# Patient Record
Sex: Female | Born: 1965 | Race: White | Marital: Single | Smoking: Never smoker
Health system: Southern US, Community
[De-identification: ages and names within clinical notes are randomized; demographics above are authoritative.]

## PROBLEM LIST (undated history)

## (undated) DIAGNOSIS — S83209A Unspecified tear of unspecified meniscus, current injury, unspecified knee, initial encounter: Secondary | ICD-10-CM

## (undated) DIAGNOSIS — G43909 Migraine, unspecified, not intractable, without status migrainosus: Secondary | ICD-10-CM

## (undated) DIAGNOSIS — T7840XA Allergy, unspecified, initial encounter: Secondary | ICD-10-CM

## (undated) DIAGNOSIS — F419 Anxiety disorder, unspecified: Secondary | ICD-10-CM

## (undated) DIAGNOSIS — G4700 Insomnia, unspecified: Secondary | ICD-10-CM

## (undated) DIAGNOSIS — F3281 Premenstrual dysphoric disorder: Secondary | ICD-10-CM

## (undated) HISTORY — DX: Anxiety disorder, unspecified: F41.9

## (undated) HISTORY — PX: TONSILLECTOMY: SHX5217

## (undated) HISTORY — DX: Allergy, unspecified, initial encounter: T78.40XA

## (undated) HISTORY — DX: Unspecified tear of unspecified meniscus, current injury, unspecified knee, initial encounter: S83.209A

## (undated) HISTORY — DX: Premenstrual dysphoric disorder: F32.81

## (undated) HISTORY — DX: Migraine, unspecified, not intractable, without status migrainosus: G43.909

## (undated) HISTORY — DX: Insomnia, unspecified: G47.00

---

## 2005-10-26 ENCOUNTER — Ambulatory Visit: Payer: Self-pay

## 2005-11-16 ENCOUNTER — Ambulatory Visit: Payer: Self-pay

## 2008-01-02 DIAGNOSIS — D229 Melanocytic nevi, unspecified: Secondary | ICD-10-CM

## 2008-01-02 HISTORY — DX: Melanocytic nevi, unspecified: D22.9

## 2009-12-16 ENCOUNTER — Encounter: Admission: RE | Admit: 2009-12-16 | Discharge: 2009-12-16 | Payer: Self-pay | Admitting: Internal Medicine

## 2012-02-02 ENCOUNTER — Ambulatory Visit (INDEPENDENT_AMBULATORY_CARE_PROVIDER_SITE_OTHER): Payer: Managed Care, Other (non HMO) | Admitting: Sports Medicine

## 2012-02-02 VITALS — BP 118/70

## 2012-02-02 DIAGNOSIS — M25559 Pain in unspecified hip: Secondary | ICD-10-CM

## 2012-02-02 DIAGNOSIS — M25551 Pain in right hip: Secondary | ICD-10-CM

## 2012-02-02 DIAGNOSIS — R269 Unspecified abnormalities of gait and mobility: Secondary | ICD-10-CM

## 2012-02-02 NOTE — Assessment & Plan Note (Signed)
Custom orthotics as above. 

## 2012-02-02 NOTE — Assessment & Plan Note (Signed)
Symptoms most likely related to upper hamstring as well as hip abductor strain. Radicular symptoms resolved. She may use oral NSAIDs, as well as I would like her to start the hamstring rehabilitation protocol. Orthotics as above. She may come back to see [place] on an as-needed basis.

## 2012-02-02 NOTE — Progress Notes (Signed)
  Subjective:    Patient ID: Gina [name], female    DOB: Oct 08, 1966, 46 y.o.   MRN: [id number]  HPI This patient comes to see [place] for new orthotics. She has rigid ones that were made at a different facility, three quarters length, which she does not particularly like. She did have an injury approximately 1 month ago while doing roller derby, where she was stretching, felt a pop in her right low back, and resultant pain traveling down the posterior aspect of her right thigh. She went to a few manipulations with Dr. Katrinka Blazing, as well as some tissue friction therapy. Overall the radicular type pain has resolved. She still has some pain in the posterior hip.  Past medical history: None. Past surgical history: None. Allergies: None. 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 Family history: Negative for diabetes, positive for heart disease, and high blood pressure. Review of Systems    No fevers, chills, night sweats, weight loss, chest pain, or shortness of breath.  Social History: Non-smoker. Objective:   Physical Exam General:  Well developed, well nourished, and in no acute distress. Neuro:  Alert and oriented x3, extra-ocular muscles intact. Skin: Warm and dry, no rashes noted. Respiratory:  Not using accessory muscles, speaking in full sentences. Musculoskeletal: Hip: ROM IR: 45 Deg, ER: 45 Deg, Flexion: 120 Deg, Extension: 100 Deg, Abduction: 45 Deg, Adduction: 45 Deg Strength IR: 5/5, ER: 5/5, Flexion: 5/5, Extension: 5/5, Abduction: 5/5, Adduction: 5/5 Pelvic alignment unremarkable to inspection and palpation. Standing hip rotation and gait without trendelenburg sign / unsteadiness. Greater trochanter without tenderness to palpation. No tenderness over piriformis and greater trochanter. No pain with FABER or FADIR. No SI joint tenderness and normal minimal SI movement.  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 Bunionette present. Leg lengths equal. Left calf better developed than RT Good hip abductor strength.  She lacks approximately 30 degrees of hamstring flexibility on the right side compared to the left.  Patient was fitted for a standard, cushioned, semi-rigid orthotic. The orthotic was heated and afterward the patient stood on the orthotic blank positioned on the orthotic stand. The patient was positioned in subtalar neutral position and 10 degrees of ankle dorsiflexion in a weight bearing stance. After completion of molding, a stable base was applied to the orthotic blank. The blank was ground to a stable position for weight bearing. Size: 8 Base: Blue EVA Posting and Padding: none  Comfortable in shoes with ambulation.    Assessment & Plan:

## 2012-02-02 NOTE — Patient Instructions (Signed)
Museum/gallery exhibitions officer.  Hamstring rehab: Hamstring curls: Start with 3 sets of 15 (no weight); Progress by 5 reps every 3 days until you reach 3 sets of 30; After 3 days at 3 sets of 30, add 2lb ankle weight at 3 sets of 10; Increase every 5 days by 5 reps. You may add 2lbs ankle weight once weekly. Hamstring swings- swing leg backwards and curl at the end of the swing. Follow same schedule as above. Hamstring running lunges- running lunge position means no more than 45 degrees of knee flexion and running motion. Follow same schedule as above.  Once the above becomes easy (takes at least a month), advance to:  Plyometrics: bound and land in running position; 3 sets of 15 level first; then up a rise; then down a rise; only advance when level is easy. No greater than 45 deg knee flexion. Stride drills- 10 x 75 yards; bounding but no more than 45 deg knee flexion. Level for 1 week; then add up hill for 1 week; then add down hill. Hop drills- 15 repetitive hops- 3 sets; compare injured to non injured leg Nordic hamstring exercises- stabilize body kneeling;1 set of 5-10 and progress slowly(Don't attempt if painful)  Come back to see [place] in 4-6 weeks if no better.

## 2012-12-19 ENCOUNTER — Ambulatory Visit (INDEPENDENT_AMBULATORY_CARE_PROVIDER_SITE_OTHER): Payer: Managed Care, Other (non HMO) | Admitting: Family Medicine

## 2012-12-19 VITALS — BP 120/80 | Ht 66.0 in | Wt 140.0 lb

## 2012-12-19 DIAGNOSIS — S43109A Unspecified dislocation of unspecified acromioclavicular joint, initial encounter: Secondary | ICD-10-CM

## 2012-12-19 HISTORY — DX: Unspecified dislocation of unspecified acromioclavicular joint, initial encounter: S43.109A

## 2012-12-19 NOTE — Progress Notes (Signed)
Chief complaint: Left shoulder pain  History of present illness: Patient is a 47 year old female who does roller derby on the weekends. Patient was doing roller derby had a tactile where she took 8 and other participating down and fell on the top of her left shoulder. Patient states that it does feel bruised but also is having trouble lifting her arm. Patient denies any radiation into the hand or any numbness but states that the pain does keep her up at night. She had this happen 2 days ago. Patient states that it minimal he is improving.  Past medical history, social, surgical and family history all reviewed.   Denies fever, chills, nausea vomiting abdominal pain, dysuria, chest pain, shortness of breath dyspnea on exertion or numbness in extremities  Physical exam Blood pressure 120/80, height 5\' 6"  (1.676 m), weight 140 lb (63.504 kg). General: No apparent distress alert and oriented x3 mood and affect somewhat normal. Respiratory: Patient's speak in full sentences and does not appear short of breath Skin: Warm dry intact with no signs of infection or rash Neuro: Cranial nerves II through XII are intact, neurovascularly intact in all extremities with 2+ DTRs and 2+ pulses. Shoulder exam: On inspection there is no gross deformity. On palpation patient is severely tender over the left a.c. Joint. Patient does have a positive crossover sign. Patient has no significant impingement signs but does have pain with certain movements. Patient has a negative empty can sign. She is neurovascularly intact distally. Patient's neck has full range of motion.  Muscle skeletal ultrasound was performed and interpreted by me. Patient's limited ultrasound study did show that she had some hypoechoic changes surrounding the a.c. Joint but no significant step-off. Patient's rotator cuff is intact with no hypoechoic changes or neovascularization in the area. No true tear seen.

## 2012-12-19 NOTE — Assessment & Plan Note (Signed)
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 Patient was offered a potential steroid injection today which she declined. Patient will continue with her over-the-counter anti-inflammatories and was given different range of motion exercises. Patient will stay out of contact sports for the next 2 weeks. Patient though she can followup if she does not notice any significant improvement in the next 2 weeks.

## 2013-07-13 ENCOUNTER — Encounter: Payer: Self-pay | Admitting: Family Medicine

## 2013-07-13 ENCOUNTER — Ambulatory Visit (INDEPENDENT_AMBULATORY_CARE_PROVIDER_SITE_OTHER): Payer: BC Managed Care – PPO | Admitting: Family Medicine

## 2013-07-13 VITALS — BP 124/78 | HR 62 | Wt 143.0 lb

## 2013-07-13 DIAGNOSIS — M999 Biomechanical lesion, unspecified: Secondary | ICD-10-CM

## 2013-07-13 DIAGNOSIS — S8002XA Contusion of left knee, initial encounter: Secondary | ICD-10-CM

## 2013-07-13 DIAGNOSIS — M9981 Other biomechanical lesions of cervical region: Secondary | ICD-10-CM

## 2013-07-13 DIAGNOSIS — S8000XA Contusion of unspecified knee, initial encounter: Secondary | ICD-10-CM | POA: Insufficient documentation

## 2013-07-13 DIAGNOSIS — M542 Cervicalgia: Secondary | ICD-10-CM | POA: Insufficient documentation

## 2013-07-13 NOTE — Assessment & Plan Note (Signed)
Diagnosis prognosis and rehabilitation exercises discussed with patient at length today. Patient is responding osteopathic manipulation. Continue current therapy. Patient was given a home exercise program and we did go over technique. Discussed posture and ergonomics at work. Follow up in 4-6 weeks

## 2013-07-13 NOTE — Progress Notes (Signed)
Subjective:    CC: Knee pain and manipulation for chronic neck pain  HPI: Patient is a 47 year old female who I've seen at a different practice previously for osteopathic manipulation. Patient is coming in with 2 problems.  1. neck pain- patient does do roller derby on a regular basis time to time after having significant amount Contact she can have discomfort. Patient does respond well to manipulation therapy. Patient also has a history of migraines and states that this usually is very helpful. Patient recently has been playing on 2 different roller derby teams and this seems to have increased exacerbation in her neck pain. Patient denies any radiation down her arm, denies any numbness or tingling. Patient states that it is more of a dull aching sensation on the right side. Patient denies any dizziness lightheadedness, fevers, chills, or any abnormal weight loss. Patient has tried over-the-counter natural supplements with minimal improvement.  2. Knee pain- left knee pain for 2-1/2 months. Patient remembers falling on the knee and did have some swelling. Patient does have a past medical history significant for meniscal tear of the medial meniscus on this knee. Patient fell directly onto this knee and continues to have pain any time that she has direct contact undersurface of the patella. Patient denies any swelling denies any numbness or tingling denies any radiation down the leg. Patient still able to do all her activities of daily living but unfortunately any direct contact seems to be tender. Patient has tried icing which has been somewhat beneficial. Patient is wearing a compression sleeve on the knee but this does not seem to be making a significant difference.  Past medical history, Surgical history, Family history not pertinant except as noted below, Social history, Allergies, and medications have been entered into the medical record, reviewed, and no changes needed.   Review of Systems: No  fevers, chills, night sweats, weight loss, chest pain, or shortness of breath abdominal pain, dizziness, lightheadedness, visual changes, numbness in the extremities  Objective:   Blood pressure 124/78, pulse 62, weight 143 lb (64.864 kg), SpO2 98.00%.  General: Well Developed, well nourished, and in no acute distress.  Neuro: Alert and oriented x3, extra-ocular muscles intact, sensation grossly intact.  HEENT: Normocephalic, atraumatic, pupils equal round reactive to light, neck supple, no masses, no lymphadenopathy, thyroid nonpalpable.  Skin: Warm and dry, no rashes. Cardiac:  no lower extremity edema. Respiratory: Not using accessory muscles, speaking in full sentences. Abdominal: NT, soft Gait: Nonantlagic, good balance and coordination Lymphatic: no lymphadenopathy in neck or axillae on palpation, non tender.  Musculoskeletal: Inspection and palpation of the right and left upper extremities including the shoulders elbows and wrist are unremarkable with full range of motion and good muscle strength and tone. Inspection and palpation of the right and left lower extremities including the  hip and ankles are unremarkable and nontender with full range of motion and good muscle strength and tone and are symmetric.  Knee: Left Normal to inspection with no erythema or effusion or obvious bony abnormalities. Patient does have some tenderness at the inferior pole of the patella, and medial joint line ROM full in flexion and extension and lower leg rotation. Ligaments with solid consistent endpoints including ACL, PCL, LCL, MCL. Mild positive McMurray's Painful patellar compression Patellar glide with mild crepitus. Patellar and quadriceps tendons unremarkable. Hamstring and quadriceps strength is normal.   Neck: Inspection unremarkable. Negative Spurling's maneuver. Full neck range of motion Grip strength and sensation normal in bilateral hands Strength good  C4 to T1 distribution No  sensory change to C4 to T1 Negative Hoffman sign bilaterally Reflexes normal  OMT Physical Exam  Standing flexion left  Seated Flexion left  Cervical  C2 right  C3 F RS left  Thoracic T7 E RS left  Lumbar L2 F RS left  Sacrum Left on left   Impression and Recommendations:

## 2013-07-13 NOTE — Assessment & Plan Note (Signed)
Decision today to treat with OMT was based on Physical Exam  After verbal consent patient was treated with hvla, me techniques in cervical and thoracic areas  Patient tolerated the procedure well with improvement in symptoms  Patient given exercises, stretches and lifestyle modifications  See medications in patient instructions if given  Patient will follow up in 4-6 weeks

## 2013-07-13 NOTE — Assessment & Plan Note (Signed)
Does have what appears to be a patella contusion. Patient likely has a chronicity of it is secondary to her continuing to play and having more of an overuse. Discuss strapping which patient will do with tape to try to avoid excessive tension put on the area secondary to the patellar tendon. Talked about icing protocol Discussed proper cushioning Patient come back in 4-6 weeks. She continues to have trouble we'll consider topical anti-inflammatory.

## 2013-07-13 NOTE — Patient Instructions (Addendum)
Get a strap or tape for the patella to change the pull of the tendon Get some type of cushioning on the knee.  Icing 20 minutes after activity Continue Vit D.  Call me if any questions.  Can come back in 4-6 weeks for manipulation

## 2014-02-11 ENCOUNTER — Ambulatory Visit (INDEPENDENT_AMBULATORY_CARE_PROVIDER_SITE_OTHER): Payer: BC Managed Care – PPO | Admitting: Family Medicine

## 2014-02-11 VITALS — BP 148/90 | HR 68

## 2014-02-11 DIAGNOSIS — M542 Cervicalgia: Secondary | ICD-10-CM

## 2014-02-11 DIAGNOSIS — S79929A Unspecified injury of unspecified thigh, initial encounter: Secondary | ICD-10-CM

## 2014-02-11 DIAGNOSIS — S76301A Unspecified injury of muscle, fascia and tendon of the posterior muscle group at thigh level, right thigh, initial encounter: Secondary | ICD-10-CM | POA: Insufficient documentation

## 2014-02-11 DIAGNOSIS — M9981 Other biomechanical lesions of cervical region: Secondary | ICD-10-CM

## 2014-02-11 DIAGNOSIS — S79919A Unspecified injury of unspecified hip, initial encounter: Secondary | ICD-10-CM

## 2014-02-11 DIAGNOSIS — M999 Biomechanical lesion, unspecified: Secondary | ICD-10-CM

## 2014-02-11 MED ORDER — DIAZEPAM 5 MG PO TABS: 5.0000 mg | ORAL_TABLET | Freq: Two times a day (BID) | ORAL | Status: DC | PRN

## 2014-02-11 NOTE — Assessment & Plan Note (Signed)
Patient continues to respond very well to osteopathic manipulation. Patient given more postural exercises today that could be helpful. Discuss other home remedies that could be beneficial. Patient will come back again in 3-6 weeks for further of manipulation and evaluation.

## 2014-02-11 NOTE — Patient Instructions (Signed)
Very nice to see you as always Try exercises for the hamstring You know where I am if you need me  If shoulder is not bette rin 2 weeks come back and we can do ultrasound and injection.

## 2014-02-11 NOTE — Assessment & Plan Note (Signed)
Patient is noticing tightness in her hamstring and I do as well on physical exam today. Patient given home exercises specifically askling exercises for the hamstring I think will be beneficial. Patient was showed proper technique today. Patient will try these interventions and come back and see me again in 3-6 weeks for further evaluation. If she is doing well we will add a Nordic exercises.

## 2014-02-11 NOTE — Progress Notes (Signed)
  Subjective:    CC: Knee pain and manipulation for chronic neck pain  HPI: Patient is a 48 year old female who I've seen at a different practice previously for osteopathic manipulation. Patient is coming in with 2 problems.  1. neck pain- patient has had neck pain for quite some time but has been able to do well with manipulation. Patient is having some discomfort most of the right side compared to her regular left side. Patient has been trying home exercises with no significant improvement. Patient has tried medicine with no improve. Patient continues to do roller derby  2. right hamstring pain-patient has had a chronic hamstring problem before and has noticed some mild tightness. Patient is not do any exercises for it is wondering if there is anything else. Patient denies any bruising, any swelling, and continues to wear the compression which has been helpful.  Past medical history, Surgical history, Family history not pertinant except as noted below, Social history, Allergies, and medications have been entered into the medical record, reviewed, and no changes needed.   Review of Systems: No fevers, chills, night sweats, weight loss, chest pain, or shortness of breath abdominal pain, dizziness, lightheadedness, visual changes, numbness in the extremities  Objective:   Blood pressure 148/90, pulse 68, SpO2 99.00%.  General: Well Developed, well nourished, and in no acute distress.  Neuro: Alert and oriented x3, extra-ocular muscles intact, sensation grossly intact.  HEENT: Normocephalic, atraumatic, pupils equal round reactive to light, neck supple, no masses, no lymphadenopathy, thyroid nonpalpable.  Skin: Warm and dry, no rashes. Cardiac:  no lower extremity edema. Respiratory: Not using accessory muscles, speaking in full sentences. Abdominal: NT, soft Gait: Nonantlagic, good balance and coordination Lymphatic: no lymphadenopathy in neck or axillae on palpation, non tender.   Musculoskeletal: Inspection and palpation of the right and left upper extremities including the shoulders elbows and wrist are unremarkable with full range of motion and good muscle strength and tone. Inspection and palpation of the right and left lower extremities including the  hip and ankles are unremarkable and nontender with full range of motion and good muscle strength and tone and are symmetric.  Knee: Left Normal to inspection with no erythema or effusion or obvious bony abnormalities. Patient does have some tenderness at the inferior pole of the patella, and medial joint line ROM full in flexion and extension and lower leg rotation. Ligaments with solid consistent endpoints including ACL, PCL, LCL, MCL. Mild positive McMurray's Painful patellar compression Patellar glide with mild crepitus. Patellar and quadriceps tendons unremarkable. Hamstring and quadriceps strength is normal. Patient does have mild tightness of the hamstrings bilaterally  Neck: Inspection unremarkable. Negative Spurling's maneuver. Full neck range of motion Grip strength and sensation normal in bilateral hands Strength good C4 to T1 distribution No sensory change to C4 to T1 Negative Hoffman sign bilaterally Reflexes normal  OMT Physical Exam  Standing flexion left  Seated Flexion left  Cervical  C2 right  C3 F RS left  Thoracic T7 E RS left  Lumbar L2 F RS left  Sacrum Left on left   Impression and Recommendations:

## 2014-02-11 NOTE — Assessment & Plan Note (Signed)
Decision today to treat with OMT was based on Physical Exam  After verbal consent patient was treated with hvla, me techniques in cervical and thoracic areas  Patient tolerated the procedure well with improvement in symptoms  Patient given exercises, stretches and lifestyle modifications  See medications in patient instructions if given  Patient will follow up in 3-6 weeks

## 2016-02-26 ENCOUNTER — Ambulatory Visit (INDEPENDENT_AMBULATORY_CARE_PROVIDER_SITE_OTHER): Payer: 59 | Admitting: Family Medicine

## 2016-02-26 VITALS — BP 122/80 | HR 64 | Wt 126.0 lb

## 2016-02-26 DIAGNOSIS — M542 Cervicalgia: Secondary | ICD-10-CM | POA: Diagnosis not present

## 2016-02-26 DIAGNOSIS — M9901 Segmental and somatic dysfunction of cervical region: Secondary | ICD-10-CM

## 2016-02-26 DIAGNOSIS — M9903 Segmental and somatic dysfunction of lumbar region: Secondary | ICD-10-CM

## 2016-02-26 DIAGNOSIS — M25551 Pain in right hip: Secondary | ICD-10-CM | POA: Diagnosis not present

## 2016-02-26 DIAGNOSIS — M9902 Segmental and somatic dysfunction of thoracic region: Secondary | ICD-10-CM

## 2016-02-26 DIAGNOSIS — M999 Biomechanical lesion, unspecified: Secondary | ICD-10-CM | POA: Insufficient documentation

## 2016-02-26 NOTE — Patient Instructions (Signed)
Good to see you  Ice is your friend On wall with heels, butt shoulder and head touching for a goal of 5 minutes daily  Keep me updated on any changes You know where I am and see me when you need me Bjorn Loser, look at his website Vivi Ferns for dry needling.

## 2016-02-26 NOTE — Assessment & Plan Note (Signed)
Decision today to treat with OMT was based on Physical Exam  After verbal consent patient was treated with hvla, me techniques in cervical and thoracic an lumbarareas  Patient tolerated the procedure well with improvement in symptoms  Patient given exercises, stretches and lifestyle modifications  See medications in patient instructions if given  Patient will follow up in 3-6 weeks

## 2016-02-26 NOTE — Progress Notes (Signed)
[name] [place], [place] [address] Phone: [phone] Subjective:      CC: right lower back pain and hip pain  [id number] Gina [name] is a 50 y.o. female coming in with complaint of right lower back and hip pain. Patient has not been seen for 2 years. Used to respond well to osteopathic manipulation for neck pain. Overall is feeling relatively well. States that she is starting have some increasing discomfort from time to time. Denies any radiation down the arm or any numbness or tingling. Does have muscle relaxers as well as benzodiazepines is sometimes helps when she needs it. Continues multiple different vitamins and minerals. Patient states that it is not stopping him from activity but has more difficulty with exercises and things.     Past Medical History  Diagnosis Date  . Allergy   . Migraine   . Acute meniscal tear of knee    No past surgical history on file. Social History   Social History  . Marital Status: Significant Other    Spouse Name: N/A  . Number of Children: N/A  . Years of Education: N/A   Social History Main Topics  . Smoking status: Not on file  . Smokeless tobacco: Not on file  . Alcohol Use: Yes  . Drug Use: No  . Sexual Activity: Yes     Comment: with females   Other Topics Concern  . Not on file   Social History Narrative  . No narrative on file   No Known Allergies No family history on file. No family history of rheumatological diseases  Past medical history, social, surgical and family history all reviewed in electronic medical record.  No pertanent information unless stated regarding to the chief complaint.   Review of Systems: No headache, visual changes, nausea, vomiting, diarrhea, constipation, dizziness, abdominal pain, skin rash, fevers, chills, night sweats, weight loss, swollen lymph nodes, body aches, joint swelling, muscle aches, chest pain, shortness of breath, mood  changes.   Objective Blood pressure 122/80, pulse 64, weight 126 lb (57.153 kg).  General: No apparent distress alert and oriented x3 mood and affect normal, dressed appropriately.  HEENT: Pupils equal, extraocular movements intact  Respiratory: Patient's speak in full sentences and does not appear short of breath  Cardiovascular: No lower extremity edema, non tender, no erythema  Skin: Warm dry intact with no signs of infection or rash on extremities or on axial skeleton.  Abdomen: Soft nontender  Neuro: Cranial nerves II through XII are intact, neurovascularly intact in all extremities with 2+ DTRs and 2+ pulses.  Lymph: No lymphadenopathy of posterior or anterior cervical chain or axillae bilaterally.  Gait normal with good balance and coordination.  MSK:  Non tender with full range of motion and good stability and symmetric strength and tone of shoulders, elbows, wrist, hip, knee and ankles bilaterally.  Back Exam:  Inspection: Unremarkable  Motion: Flexion 45 deg, Extension 25 deg, Side Bending to 45 deg bilaterally,  Rotation to 45 deg bilaterally  SLR laying: Negative  XSLR laying: Negative  Palpable tenderness: mildtenderness of the right sacroiliac joint FABER: positive right Sensory change: Gross sensation intact to all lumbar and sacral dermatomes.  Reflexes: 2+ at both patellar tendons, 2+ at achilles tendons, Babinski's downgoing.  Strength at foot  Plantar-flexion: 5/5 Dorsi-flexion: 5/5 Eversion: 5/5 Inversion: 5/5  Leg strength  Quad: 5/5 Hamstring: 5/5 Hip flexor: 5/5 Hip abductors: 4/5  Gait unremarkable.  Osteopathic findings C2 flexed  rotated and side bent right C6 flexed rotated and side bent right T3 extended rotated and side bent left L2 flexed rotated and side bent right    Impression and Recommendations:     This case required medical decision making of moderate complexity.      Note: This dictation was prepared with Dragon dictation along with  smaller phrase technology. Any transcriptional errors that result from this process are unintentional.

## 2016-02-26 NOTE — Assessment & Plan Note (Signed)
Patient states that she is responding fairly well to dry needling. Patient did have a tear of the hamstring previously. Overall though seems to be doing much better at this time. We discussed the possibility of further formal physical therapy which patient declined. Hopefully that the manipulation will be held full. Encourage her to continue to be active. Follow up in 4 weeks.

## 2016-02-26 NOTE — Assessment & Plan Note (Signed)
Overall patient continues respond to conservative therapy. We discussed postural exercises and given a handout today. We discussed what activities to avoid. Follow up in 4 weeks

## 2017-12-09 DIAGNOSIS — Z0001 Encounter for general adult medical examination with abnormal findings: Secondary | ICD-10-CM | POA: Diagnosis not present

## 2017-12-09 DIAGNOSIS — F39 Unspecified mood [affective] disorder: Secondary | ICD-10-CM | POA: Diagnosis not present

## 2017-12-09 DIAGNOSIS — N959 Unspecified menopausal and perimenopausal disorder: Secondary | ICD-10-CM | POA: Diagnosis not present

## 2017-12-09 DIAGNOSIS — F411 Generalized anxiety disorder: Secondary | ICD-10-CM | POA: Diagnosis not present

## 2017-12-13 DIAGNOSIS — D649 Anemia, unspecified: Secondary | ICD-10-CM | POA: Diagnosis not present

## 2017-12-16 DIAGNOSIS — D509 Iron deficiency anemia, unspecified: Secondary | ICD-10-CM | POA: Diagnosis not present

## 2017-12-16 DIAGNOSIS — F5089 Other specified eating disorder: Secondary | ICD-10-CM | POA: Diagnosis not present

## 2017-12-16 DIAGNOSIS — N959 Unspecified menopausal and perimenopausal disorder: Secondary | ICD-10-CM | POA: Diagnosis not present

## 2017-12-21 ENCOUNTER — Encounter: Payer: Self-pay | Admitting: Hematology and Oncology

## 2018-01-09 ENCOUNTER — Inpatient Hospital Stay: Payer: BLUE CROSS/BLUE SHIELD | Attending: Hematology and Oncology | Admitting: Hematology and Oncology

## 2018-01-09 ENCOUNTER — Telehealth: Payer: Self-pay | Admitting: Hematology and Oncology

## 2018-01-09 VITALS — BP 131/84 | HR 76 | Temp 98.5°F | Resp 20 | Ht 66.0 in | Wt 132.9 lb

## 2018-01-09 DIAGNOSIS — D509 Iron deficiency anemia, unspecified: Secondary | ICD-10-CM

## 2018-01-09 DIAGNOSIS — N920 Excessive and frequent menstruation with regular cycle: Secondary | ICD-10-CM | POA: Diagnosis not present

## 2018-01-09 DIAGNOSIS — F39 Unspecified mood [affective] disorder: Secondary | ICD-10-CM | POA: Diagnosis not present

## 2018-01-09 DIAGNOSIS — F5089 Other specified eating disorder: Secondary | ICD-10-CM | POA: Diagnosis not present

## 2018-01-09 MED ORDER — LAMOTRIGINE ER 50 MG PO TB24: 1.0000 | ORAL_TABLET | Freq: Every day | ORAL | Status: DC

## 2018-01-09 NOTE — Telephone Encounter (Signed)
Gave patient AVS and calendar of upcoming march and June appointments.

## 2018-01-09 NOTE — Assessment & Plan Note (Signed)
Iron deficiency anemia: I discussed with the patient the process of iron absorption. I counseled extensively regarding the different causes of iron deficiency including blood loss and malabsorption. I recommended that the patient see Gastroenterology.  She will think about it and let me know.  It is possible that the patient may still have an occult source of bleeding.  However malabsorption is also a possibility.  Recommendation: 1. Proceed with 2 doses of IV iron infusion with Feraheme 2. celiac antibodies will be obtained 3.  Patient is very interested in finding out about Hepcidin and Ferroportin  I discussed with her that they do not help as an management.  I discussed with the patient that potentially there may be a need for additional IV iron infusions if the iron levels were to remain low in the future. The frequency of need of IV iron would depend on many other factors including the degree of absorption.  Return to clinic in 3 months with recheck on iron studies and hemoglobin.

## 2018-01-09 NOTE — Progress Notes (Signed)
[facility] CONSULT NOTE  Patient Care Team: Hayden Rasmussen, MD as PCP - General (Family Medicine)  CHIEF COMPLAINTS/PURPOSE OF CONSULTATION:  Severe iron deficiency anemia  HISTORY OF PRESENTING ILLNESS:  Gina [name] 52 y.o. female is here because of chronic iron deficiency anemia.  Patient tells me that she was previously working for a integrative medicine physician and at that time she had received 3 doses of IV iron therapy and the last dose was 3 years ago.  She has been on oral iron therapy since she was age 35 on and off.  Most recently 3 weeks ago she was noted to have a hemoglobin of 6.1 and a ferritin of 2.5 and was started on oral iron supplementation.  She does not feel very symptomatic in spite of the low hemoglobin.  She denies any chest pain shortness of breath.  She does not have any dizziness or lightheadedness.  She does feel fatigued when she lifts heavy objects.  She denies any blood in the stool.  Her menstrual cycles have not been heavy.  She tells me occasionally she is intolerant to wheat products but most of the time she has no problems eating them. Currently she works at [employer]  I reviewed her records extensively and collaborated the history with the patient.  MEDICAL HISTORY:  Past Medical History:  Diagnosis Date  . Acute meniscal tear of knee   . Allergy   . Migraine     SURGICAL HISTORY: No past surgical history on file.  SOCIAL HISTORY: Social History   Socioeconomic History  . Marital status: Significant Other    Spouse name: Not on file  . Number of children: Not on file  . Years of education: Not on file  . Highest education level: Not on file  Social Needs  . Financial resource strain: Not on file  . Food insecurity - worry: Not on file  . Food insecurity - inability: Not on file  . Transportation needs - medical: Not on file  . Transportation needs - non-medical: Not on file  Occupational History  . Not  on file  Tobacco Use  . Smoking status: Not on file  Substance and Sexual Activity  . Alcohol use: Yes  . Drug use: No  . Sexual activity: Yes    Comment: with females  Other Topics Concern  . Not on file  Social History Narrative  . Not on file    FAMILY HISTORY: No family history on file.  ALLERGIES:  has No Known Allergies.  MEDICATIONS:  Current Outpatient Medications  Medication Sig Dispense Refill  . LamoTRIgine (LAMICTAL XR) 50 MG TB24 24 hour tablet Take 1 tablet (50 mg total) by mouth daily. 30 tablet    No current facility-administered medications for this visit.     REVIEW OF SYSTEMS:   Constitutional: Denies fevers, chills or abnormal night sweats Eyes: Denies blurriness of vision, double vision or watery eyes Ears, nose, mouth, throat, and face: Denies mucositis or sore throat Respiratory: Denies cough, dyspnea or wheezes Cardiovascular: Denies palpitation, chest discomfort or lower extremity swelling Gastrointestinal:  Denies nausea, heartburn or change in bowel habits Skin: Denies abnormal skin rashes Lymphatics: Denies new lymphadenopathy or easy bruising Neurological:Denies numbness, tingling or new weaknesses Behavioral/Psych: Mood is stable, no new changes   All other systems were reviewed with the patient and are negative.  PHYSICAL EXAMINATION: ECOG PERFORMANCE STATUS: 1 - Symptomatic but completely ambulatory  Vitals:   01/09/18 1547  BP: 131/84  Pulse: 76  Resp: 20  Temp: 98.5 F (36.9 C)  SpO2: 100%   Filed Weights   01/09/18 1547  Weight: 132 lb 14.4 oz (60.3 kg)    GENERAL:alert, no distress and comfortable SKIN: skin color, texture, turgor are normal, no rashes or significant lesions EYES: normal, conjunctiva are pink and non-injected, sclera clear OROPHARYNX:no exudate, no erythema and lips, buccal mucosa, and tongue normal  NECK: supple, thyroid normal size, non-tender, without nodularity LYMPH:  no palpable lymphadenopathy  in the cervical, axillary or inguinal LUNGS: clear to auscultation and percussion with normal breathing effort HEART: regular rate & rhythm and no murmurs and no lower extremity edema ABDOMEN:abdomen soft, non-tender and normal bowel sounds Musculoskeletal:no cyanosis of digits and no clubbing  PSYCH: alert & oriented x 3 with fluent speech NEURO: no focal motor/sensory deficits  RADIOGRAPHIC STUDIES: I have personally reviewed the radiological reports and agreed with the findings in the report.  ASSESSMENT AND PLAN:  Iron deficiency anemia CBC differential on 12/20/2017: Hemoglobin 6.1, MCV 78.9, platelets 526, ferritin 2.7 Iron deficiency anemia: I discussed with the patient the process of iron absorption. I counseled extensively regarding the different causes of iron deficiency including blood loss and malabsorption. I recommended that the patient see Gastroenterology.  She will think about it and let me know.  It is possible that the patient may still have an occult source of bleeding.  However malabsorption is also a possibility.  Recommendation: 1. Proceed with 2 doses of IV iron infusion with Feraheme 2. celiac antibodies will be obtained 3.  Patient is very interested in finding out about Hepcidin and Ferroportin  I discussed with her that they do not help as an management.  I discussed with the patient that potentially there may be a need for additional IV iron infusions if the iron levels were to remain low in the future. The frequency of need of IV iron would depend on many other factors including the degree of absorption.  Return to clinic in 3 months with recheck on iron studies and hemoglobin.     All questions were answered. The patient knows to call the clinic with any problems, questions or concerns.    Harriette Ohara, MD 01/09/18

## 2018-01-11 ENCOUNTER — Other Ambulatory Visit: Payer: Self-pay | Admitting: Hematology and Oncology

## 2018-01-13 ENCOUNTER — Inpatient Hospital Stay: Payer: BLUE CROSS/BLUE SHIELD

## 2018-01-13 VITALS — BP 126/82 | HR 63 | Temp 98.6°F | Resp 16

## 2018-01-13 DIAGNOSIS — D509 Iron deficiency anemia, unspecified: Secondary | ICD-10-CM

## 2018-01-13 LAB — CBC WITH DIFFERENTIAL (CANCER CENTER ONLY)
BASOS ABS: 0 10*3/uL (ref 0.0–0.1)
Basophils Relative: 1 %
EOS ABS: 0 10*3/uL (ref 0.0–0.5)
Eosinophils Relative: 1 %
HCT: 36 % (ref 34.8–46.6)
HEMOGLOBIN: 11.2 g/dL — AB (ref 11.6–15.9)
LYMPHS ABS: 0.9 10*3/uL (ref 0.9–3.3)
LYMPHS PCT: 26 %
MCH: 24.5 pg — AB (ref 25.1–34.0)
MCHC: 31.2 g/dL — ABNORMAL LOW (ref 31.5–36.0)
MCV: 78.5 fL — ABNORMAL LOW (ref 79.5–101.0)
Monocytes Absolute: 0.3 10*3/uL (ref 0.1–0.9)
Monocytes Relative: 9 %
NEUTROS PCT: 63 %
Neutro Abs: 2.1 10*3/uL (ref 1.5–6.5)
Platelet Count: 340 10*3/uL (ref 145–400)
RBC: 4.58 MIL/uL (ref 3.70–5.45)
RDW: 29.1 % — ABNORMAL HIGH (ref 11.2–14.5)
WBC: 3.3 10*3/uL — AB (ref 3.9–10.3)

## 2018-01-13 LAB — IRON AND TIBC
IRON: 126 ug/dL (ref 41–142)
SATURATION RATIOS: 35 % (ref 21–57)
TIBC: 364 ug/dL (ref 236–444)
UIBC: 238 ug/dL

## 2018-01-13 LAB — FERRITIN: FERRITIN: 23 ng/mL (ref 9–269)

## 2018-01-13 MED ORDER — FERUMOXYTOL INJECTION 510 MG/17 ML
510.0000 mg | Freq: Once | INTRAVENOUS | Status: AC
  Administered 2018-01-13: 510 mg via INTRAVENOUS
  Filled 2018-01-13: qty 17

## 2018-01-13 MED ORDER — SODIUM CHLORIDE 0.9 % IV SOLN
Freq: Once | INTRAVENOUS | Status: AC
  Administered 2018-01-13: 08:00:00 via INTRAVENOUS

## 2018-01-13 NOTE — Patient Instructions (Signed)

## 2018-01-17 LAB — CELIAC PANEL 10
Antigliadin Abs, IgA: 4 units (ref 0–19)
Endomysial Ab, IgA: NEGATIVE
Gliadin IgG: 4 units (ref 0–19)
IgA: 251 mg/dL (ref 87–352)
Tissue Transglut Ab: <2 U/mL (ref 0–5)

## 2018-01-20 ENCOUNTER — Inpatient Hospital Stay: Payer: BLUE CROSS/BLUE SHIELD

## 2018-01-20 VITALS — BP 145/86 | HR 65 | Temp 98.3°F | Resp 18

## 2018-01-20 DIAGNOSIS — D509 Iron deficiency anemia, unspecified: Secondary | ICD-10-CM | POA: Diagnosis not present

## 2018-01-20 MED ORDER — SODIUM CHLORIDE 0.9 % IV SOLN
Freq: Once | INTRAVENOUS | Status: AC
  Administered 2018-01-20: 08:00:00 via INTRAVENOUS

## 2018-01-20 MED ORDER — SODIUM CHLORIDE 0.9 % IV SOLN
510.0000 mg | Freq: Once | INTRAVENOUS | Status: AC
  Administered 2018-01-20: 510 mg via INTRAVENOUS
  Filled 2018-01-20: qty 17

## 2018-01-20 NOTE — Progress Notes (Signed)
Pt tolerated iron infusion well.  VS stable.  Did not stay for 30 min post observation period.  A&Ox4 and ambulatory.

## 2018-01-20 NOTE — Patient Instructions (Signed)

## 2018-02-15 NOTE — Progress Notes (Signed)
[name] [address], [place] [address] Phone: [phone] Subjective:        CC: Right wrist pain  IRJ:[id number]  Gina [name] is a 52 y.o. female coming in with complaint of right wrist pain. She has had pain for the past 3 weeks. She feels that she has bad ergonomic. The lateral aspect of the elbow. Pain travels over the extensor side of forearm into the middle finger. Denies any numbness or tingling. Pain gets better on the weekends.  Patient states that it seems to be more secondary to her work than anything else.  Tries to workout but does not do it regularly.      Past Medical History:  Diagnosis Date  . Acute meniscal tear of knee   . Allergy   . Migraine    No past surgical history on file. Social History   Socioeconomic History  . Marital status: Significant Other    Spouse name: Not on file  . Number of children: Not on file  . Years of education: Not on file  . Highest education level: Not on file  Occupational History  . Not on file  Social Needs  . Financial resource strain: Not on file  . Food insecurity:    Worry: Not on file    Inability: Not on file  . Transportation needs:    Medical: Not on file    Non-medical: Not on file  Tobacco Use  . Smoking status: Not on file  Substance and Sexual Activity  . Alcohol use: Yes  . Drug use: No  . Sexual activity: Yes    Comment: with females  Lifestyle  . Physical activity:    Days per week: Not on file    Minutes per session: Not on file  . Stress: Not on file  Relationships  . Social connections:    Talks on phone: Not on file    Gets together: Not on file    Attends religious service: Not on file    Active member of club or organization: Not on file    Attends meetings of clubs or organizations: Not on file    Relationship status: Not on file  Other Topics Concern  . Not on file  Social History Narrative  . Not on file   No Known  Allergies No family history on file.  No family history of autoimmune disease   Past medical history, social, surgical and family history all reviewed in electronic medical record.  No pertanent information unless stated regarding to the chief complaint.   Review of Systems:Review of systems updated and as accurate as of 02/16/18  No headache, visual changes, nausea, vomiting, diarrhea, constipation, dizziness, abdominal pain, skin rash, fevers, chills, night sweats, weight loss, swollen lymph nodes, body aches, joint swelling, chest pain, shortness of breath, mood changes.  Positive muscle aches  Objective  Blood pressure 118/76, pulse 79, height 5\' 6"  (1.676 m), weight 133 lb (60.3 kg), SpO2 99 %. Systems examined below as of 02/16/18   General: No apparent distress alert and oriented x3 mood and affect normal, dressed appropriately.  HEENT: Pupils equal, extraocular movements intact  Respiratory: Patient's speak in full sentences and does not appear short of breath  Cardiovascular: No lower extremity edema, non tender, no erythema  Skin: Warm dry intact with no signs of infection or rash on extremities or on axial skeleton.  Abdomen: Soft nontender  Neuro: Cranial nerves II through XII are  intact, neurovascularly intact in all extremities with 2+ DTRs and 2+ pulses.  Lymph: No lymphadenopathy of posterior or anterior cervical chain or axillae bilaterally.  Gait normal with good balance and coordination.  MSK:  Non tender with full range of motion and good stability and symmetric strength and tone of shoulders,  hip, knee and ankles bilaterally.  Wrist: Inspection normal with no visible erythema or swelling. ROM smooth and normal with good flexion and extension and ulnar/radial deviation that is symmetrical with opposite wrist. Palpation is normal over metacarpals, navicular, lunate, and TFCC; tendons without tenderness/ swelling No snuffbox tenderness. No tenderness over [place]. Strength 5/5 in all directions without pain. Negative Finkelstein, tinel's and phalens. Negative Watson's test. Patient though does not pain with resisted extension of the elbow.  MSK US performed of: Right elbow This study was ordered, performed, and interpreted by Charlann Boxer D.O.  Right elbow shows the patient does have a large partial-thickness tear of the common extensor tendon.  No avulsion fracture noted.  Hypoechoic changes and increasing Doppler flow noted.  IMPRESSION: Common extensor tendon tear    Impression and Recommendations:     This case required medical decision making of moderate complexity.      Note: This dictation was prepared with Dragon dictation along with smaller phrase technology. Any transcriptional errors that result from this process are unintentional.

## 2018-02-16 ENCOUNTER — Ambulatory Visit: Payer: Self-pay

## 2018-02-16 ENCOUNTER — Ambulatory Visit: Payer: BLUE CROSS/BLUE SHIELD | Admitting: Family Medicine

## 2018-02-16 VITALS — BP 118/76 | HR 79 | Ht 66.0 in | Wt 133.0 lb

## 2018-02-16 DIAGNOSIS — M25521 Pain in right elbow: Secondary | ICD-10-CM

## 2018-02-16 DIAGNOSIS — M7711 Lateral epicondylitis, right elbow: Secondary | ICD-10-CM | POA: Insufficient documentation

## 2018-02-16 DIAGNOSIS — M7712 Lateral epicondylitis, left elbow: Secondary | ICD-10-CM | POA: Insufficient documentation

## 2018-02-16 MED ORDER — DIAZEPAM 5 MG PO TABS: 5.0000 mg | ORAL_TABLET | Freq: Two times a day (BID) | ORAL | 0 refills | Status: DC | PRN

## 2018-02-16 MED ORDER — NITROGLYCERIN 0.2 MG/HR TD PT24
MEDICATED_PATCH | TRANSDERMAL | 1 refills | Status: DC
Start: 2018-02-16 — End: 2018-05-04

## 2018-02-16 NOTE — Assessment & Plan Note (Signed)
Lateral epicondylitis tear noted.  Discussed icing regimen and home exercises.  Patient given a wrist brace.  Topical anti-inflammatories and nitroglycerin.

## 2018-02-16 NOTE — Patient Instructions (Addendum)
Good to see you  Gina [name] is your friend.  Stay active.  Avoid overhand lifting.  Wear brace day and night for 2 weeks then nightly for 2 week.  Nitroglycerin Protocol   Apply 1/4 nitroglycerin patch to affected area daily.  Change position of patch within the affected area every 24 hours.  You may experience a headache during the first 1-2 weeks of using the patch, these should subside.  If you experience headaches after beginning nitroglycerin patch treatment, you may take your preferred over the counter pain reliever.  Another side effect of the nitroglycerin patch is skin irritation or rash related to patch adhesive.  Please notify our office if you develop more severe headaches or rash, and stop the patch.  Tendon healing with nitroglycerin patch may require 12 to 24 weeks depending on the extent of injury.  Men should not use if taking Viagra, Cialis, or Levitra.   Do not use if you have migraines or rosacea.  See me again in 4-5 weeks

## 2018-02-24 ENCOUNTER — Telehealth: Payer: Self-pay

## 2018-02-24 NOTE — Telephone Encounter (Signed)
Returned pt call regarding needing results for celiac testing. Results given. Mailed to patient per her request. Cyndia Bent RN

## 2018-03-22 ENCOUNTER — Telehealth: Payer: Self-pay

## 2018-03-22 NOTE — Progress Notes (Signed)
[name] [address], [place] [address] Phone: [phone] Subjective:    I'm seeing this patient by the request  of:    CC: Elbow pain follow-up  SNK:[id number]  Gina [name] is a 52 y.o. female coming in with complaint of elbow pain follow-up.  Was found to have a lateral epicondylitis.  Patient did have some intrasubstance tearing noted on ultrasound.  Started nitroglycerin having no side effects.  Feels like she is making progress.  Approximately 60 to 70% better.  Still some discomfort throughout the day but nothing severe.      Past Medical History:  Diagnosis Date  . Acute meniscal tear of knee   . Allergy   . Migraine    No past surgical history on file. Social History   Socioeconomic History  . Marital status: Significant Other    Spouse name: Not on file  . Number of children: Not on file  . Years of education: Not on file  . Highest education level: Not on file  Occupational History  . Not on file  Social [name]  . Financial resource strain: Not on file  . Food insecurity:    Worry: Not on file    Inability: Not on file  . Transportation [name]:    Medical: Not on file    Non-medical: Not on file  Tobacco Use  . Smoking status: Never Smoker  . Smokeless tobacco: Never Used  Substance and Sexual Activity  . Alcohol use: Yes  . Drug use: No  . Sexual activity: Yes    Comment: with females  Lifestyle  . Physical activity:    Days per week: Not on file    Minutes per session: Not on file  . Stress: Not on file  Relationships  . Social connections:    Talks on phone: Not on file    Gets together: Not on file    Attends religious service: Not on file    Active member of club or organization: Not on file    Attends meetings of clubs or organizations: Not on file    Relationship status: Not on file  Other Topics Concern  . Not on file  Social History Narrative  . Not on file   No Known Allergies No  family history on file.   Past medical history, social, surgical and family history all reviewed in electronic medical record.  No pertanent information unless stated regarding to the chief complaint.   Review of Systems:Review of systems updated and as accurate as of 03/23/18  No headache, visual changes, nausea, vomiting, diarrhea, constipation, dizziness, abdominal pain, skin rash, fevers, chills, night sweats, weight loss, swollen lymph nodes, body aches, joint swelling, muscle aches, chest pain, shortness of breath, mood changes.   Objective  Blood pressure (!) 148/90, pulse (!) 52, height 5\' 6"  (1.676 m), weight 134 lb (60.8 kg), SpO2 97 %. Systems examined below as of 03/23/18   General: No apparent distress alert and oriented x3 mood and affect normal, dressed appropriately.  HEENT: Pupils equal, extraocular movements intact  Respiratory: Patient's speak in full sentences and does not appear short of breath  Cardiovascular: No lower extremity edema, non tender, no erythema  Skin: Warm dry intact with no signs of infection or rash on extremities or on axial skeleton.  Abdomen: Soft nontender  Neuro: Cranial nerves II through XII are intact, neurovascularly intact in all extremities with 2+ DTRs and 2+ pulses.  Lymph: No lymphadenopathy  of posterior or anterior cervical chain or axillae bilaterally.  Gait normal with good balance and coordination.  MSK:  Non tender with full range of motion and good stability and symmetric strength and tone of shoulders,  wrist, hip, knee and ankles bilaterally.  Elbow: Right Unremarkable to inspection. Range of motion full pronation, supination, flexion, extension. Strength is full to all of the above directions Stable to varus, valgus stress. Negative moving valgus stress test. Mild discomfort over the lateral epicondylar region Ulnar nerve does not sublux. Negative cubital tunnel Tinel's. Contralateral elbow unremarkable  Musculoskeletal  ultrasound was performed and interpreted by Charlann Boxer D.O.   Elbow: Right Lateral epicondyle and common extensor tendon origin visualized.  Significant improvement in the intrasubstance tearing except for the deep fibers.  Patient still has some mild retraction in that area.  Increasing Doppler flow noted.  Significant decrease in hypoechoic changes  IMPRESSION: Interval healing of the lateral epicondylar area.    Impression and Recommendations:     This case required medical decision making of moderate complexity.      Note: This dictation was prepared with Dragon dictation along with smaller phrase technology. Any transcriptional errors that result from this process are unintentional.       '

## 2018-03-22 NOTE — Telephone Encounter (Signed)
Received VM from pt in regards to $300 bill for labwork that she didn't agree to. Returned pt's call and she reports she didn't agree to the following labs: Iron and TIBC, Ferritin, and CBC with diff.  She said she only agreed to the Celiac panel.  I asked if she had spoke with patient billing and she said they told her to call doctor's office.  Let her know I will route this note to Dr Lindi Adie.  No other needs per pt at this time.

## 2018-03-23 ENCOUNTER — Ambulatory Visit: Payer: Self-pay

## 2018-03-23 ENCOUNTER — Encounter: Payer: Self-pay | Admitting: Family Medicine

## 2018-03-23 ENCOUNTER — Ambulatory Visit: Payer: BLUE CROSS/BLUE SHIELD | Admitting: Family Medicine

## 2018-03-23 VITALS — BP 148/90 | HR 52 | Ht 66.0 in | Wt 134.0 lb

## 2018-03-23 DIAGNOSIS — M25521 Pain in right elbow: Secondary | ICD-10-CM

## 2018-03-23 DIAGNOSIS — M7711 Lateral epicondylitis, right elbow: Secondary | ICD-10-CM

## 2018-03-23 NOTE — Patient Instructions (Signed)
You are awesome See me again in 6 weeks  

## 2018-03-23 NOTE — Assessment & Plan Note (Signed)
Improved at this time.  Discussed icing regimen and home exercise.  Discussed which activities to doing which wants to avoid.  Patient is to increase activity as tolerated.  Follow-up with me again in 3 to 6 weeks to ensure healing appropriately.  Continue nitroglycerin at this time

## 2018-04-10 ENCOUNTER — Telehealth: Payer: Self-pay | Admitting: Hematology and Oncology

## 2018-04-10 NOTE — Telephone Encounter (Signed)
Patient called to cancel she will call back

## 2018-04-11 ENCOUNTER — Other Ambulatory Visit: Payer: BLUE CROSS/BLUE SHIELD

## 2018-04-13 ENCOUNTER — Ambulatory Visit: Payer: BLUE CROSS/BLUE SHIELD | Admitting: Hematology and Oncology

## 2018-05-03 NOTE — Progress Notes (Signed)
[name] [address], [place] [address] Phone: [phone] Subjective:     CC: Elbow pain follow-up and neck and back pain follow-up  PIR:[id number]  Gina [name] is a 52 y.o. female coming in with complaint of right elbow pain.  Found to have a more of a lateral epicondylitis.  Patient is doing better.  States that she is 80% better.  Continues to gabapentin intermittently.  Doing the exercises intermittently.  Not stopping her from activities but still can be very uncomfortable.  Neck and back pain.  Does have history of migraines.  Had been doing very well with conservative therapy and osteopathic manipulation.     Past Medical History:  Diagnosis Date  . Acute meniscal tear of knee   . Allergy   . Migraine    No past surgical history on file. Social History   Socioeconomic History  . Marital status: Significant Other    Spouse name: Not on file  . Number of children: Not on file  . Years of education: Not on file  . Highest education level: Not on file  Occupational History  . Not on file  Social Needs  . Financial resource strain: Not on file  . Food insecurity:    Worry: Not on file    Inability: Not on file  . Transportation needs:    Medical: Not on file    Non-medical: Not on file  Tobacco Use  . Smoking status: Never Smoker  . Smokeless tobacco: Never Used  Substance and Sexual Activity  . Alcohol use: Yes  . Drug use: No  . Sexual activity: Yes    Comment: with females  Lifestyle  . Physical activity:    Days per week: Not on file    Minutes per session: Not on file  . Stress: Not on file  Relationships  . Social connections:    Talks on phone: Not on file    Gets together: Not on file    Attends religious service: Not on file    Active member of club or organization: Not on file    Attends meetings of clubs or organizations: Not on file    Relationship status: Not on file  Other Topics Concern   . Not on file  Social History Narrative  . Not on file   No Known Allergies No family history on file.  No family history of autoimmune   Past medical history, social, surgical and family history all reviewed in electronic medical record.  No pertanent information unless stated regarding to the chief complaint.   Review of Systems:Review of systems updated and as accurate as of 05/04/18  No headache, visual changes, nausea, vomiting, diarrhea, constipation, dizziness, abdominal pain, skin rash, fevers, chills, night sweats, weight loss, swollen lymph nodes, body aches, joint swelling, muscle aches, chest pain, shortness of breath, mood changes.   Objective  Blood pressure 104/78, pulse 67, height 5\' 6"  (1.676 m), weight 134 lb (60.8 kg), SpO2 98 %. Systems examined below as of 05/04/18   General: No apparent distress alert and oriented x3 mood and affect normal, dressed appropriately.  HEENT: Pupils equal, extraocular movements intact  Respiratory: Patient's speak in full sentences and does not appear short of breath  Cardiovascular: No lower extremity edema, non tender, no erythema  Skin: Warm dry intact with no signs of infection or rash on extremities or on axial skeleton.  Abdomen: Soft nontender  Neuro: Cranial nerves II through XII  are intact, neurovascularly intact in all extremities with 2+ DTRs and 2+ pulses.  Lymph: No lymphadenopathy of posterior or anterior cervical chain or axillae bilaterally.  Gait normal with good balance and coordination.  MSK:  Non tender with full range of motion and good stability and symmetric strength and tone of shoulders,  wrist, hip, knee and ankles bilaterally.  Right elbow exam is fairly unremarkable with full range of motion.  Mild pain with resisted wrist extension.  Mild pain over the lateral epicondylar region but improved from previous exam  Neck: Inspection loss of lordosis. No palpable stepoffs. Negative Spurling's maneuver. Full  neck range of motion Grip strength and sensation normal in bilateral hands Strength good C4 to T1 distribution No sensory change to C4 to T1 Negative Hoffman sign bilaterally Reflexes normal Tightness of the right trapezius  Back Exam:  Inspection: Unremarkable  Motion: Flexion 45 deg, Extension 25 deg, Side Bending to 45 deg bilaterally,  Rotation to 45 deg bilaterally  SLR laying: Negative  XSLR laying: Negative  Palpable tenderness: Tender to palpation the paraspinal musculature lumbar spine right greater than left. FABER: Tightness on the right. Sensory change: Gross sensation intact to all lumbar and sacral dermatomes.  Reflexes: 2+ at both patellar tendons, 2+ at achilles tendons, Babinski's downgoing.  Strength at foot  Plantar-flexion: 5/5 Dorsi-flexion: 5/5 Eversion: 5/5 Inversion: 5/5  Leg strength  Quad: 5/5 Hamstring: 5/5 Hip flexor: 5/5 Hip abductors: 5/5  Gait unremarkable.     Osteopathic findings C2 flexed rotated and side bent right C4 flexed rotated and side bent left C6 flexed rotated and side bent left T3 extended rotated and side bent right inhaled third rib T9 extended rotated and side bent left L2 flexed rotated and side bent right Sacrum right on right  Impression and Recommendations:     This case required medical decision making of moderate complexity.      Note: This dictation was prepared with Dragon dictation along with smaller phrase technology. Any transcriptional errors that result from this process are unintentional.

## 2018-05-04 ENCOUNTER — Ambulatory Visit: Payer: BLUE CROSS/BLUE SHIELD | Admitting: Family Medicine

## 2018-05-04 ENCOUNTER — Encounter: Payer: Self-pay | Admitting: Family Medicine

## 2018-05-04 VITALS — BP 104/78 | HR 67 | Ht 66.0 in | Wt 134.0 lb

## 2018-05-04 DIAGNOSIS — M7711 Lateral epicondylitis, right elbow: Secondary | ICD-10-CM

## 2018-05-04 DIAGNOSIS — M999 Biomechanical lesion, unspecified: Secondary | ICD-10-CM

## 2018-05-04 DIAGNOSIS — M542 Cervicalgia: Secondary | ICD-10-CM

## 2018-05-04 MED ORDER — NITROGLYCERIN 0.2 MG/HR TD PT24: MEDICATED_PATCH | TRANSDERMAL | 1 refills | Status: DC

## 2018-05-04 NOTE — Assessment & Plan Note (Signed)
Decision today to treat with OMT was based on Physical Exam  After verbal consent patient was treated with HVLA, ME, FPR techniques in cervical, thoracic, lumbar and sacral areas  Patient tolerated the procedure well with improvement in symptoms  Patient given exercises, stretches and lifestyle modifications  See medications in patient instructions if given  Patient will follow up in 8 weeks 

## 2018-05-04 NOTE — Assessment & Plan Note (Signed)
Stable.  Started osteopathic manipulation.  Discussed icing regimen, posture and ergonomics.  Discussed and encouraged standing desk.  Follow-up again in 2 months

## 2018-05-04 NOTE — Assessment & Plan Note (Signed)
Stable with some improvement.  Discussed icing regimen and home exercises.  Discussed continuing the nitroglycerin until pain-free.  Patient will continue with this type of therapy.  Follow-up again in 4 weeks

## 2018-05-04 NOTE — Patient Instructions (Signed)
Good to see you  Send message in 1 month  See me again in 2 months Start nitro again regularly.

## 2018-06-02 DIAGNOSIS — N959 Unspecified menopausal and perimenopausal disorder: Secondary | ICD-10-CM | POA: Diagnosis not present

## 2018-06-13 DIAGNOSIS — Z6822 Body mass index (BMI) 22.0-22.9, adult: Secondary | ICD-10-CM | POA: Diagnosis not present

## 2018-06-13 DIAGNOSIS — Z01419 Encounter for gynecological examination (general) (routine) without abnormal findings: Secondary | ICD-10-CM | POA: Diagnosis not present

## 2018-06-23 DIAGNOSIS — L821 Other seborrheic keratosis: Secondary | ICD-10-CM | POA: Diagnosis not present

## 2018-06-23 DIAGNOSIS — L709 Acne, unspecified: Secondary | ICD-10-CM | POA: Diagnosis not present

## 2018-06-23 DIAGNOSIS — D229 Melanocytic nevi, unspecified: Secondary | ICD-10-CM | POA: Diagnosis not present

## 2018-06-29 ENCOUNTER — Encounter: Payer: Self-pay | Admitting: Family Medicine

## 2018-06-29 ENCOUNTER — Ambulatory Visit: Payer: BLUE CROSS/BLUE SHIELD | Admitting: Family Medicine

## 2018-06-29 VITALS — BP 140/80 | HR 72 | Ht 66.0 in | Wt 135.0 lb

## 2018-06-29 DIAGNOSIS — M542 Cervicalgia: Secondary | ICD-10-CM | POA: Diagnosis not present

## 2018-06-29 DIAGNOSIS — M7711 Lateral epicondylitis, right elbow: Secondary | ICD-10-CM | POA: Diagnosis not present

## 2018-06-29 DIAGNOSIS — M999 Biomechanical lesion, unspecified: Secondary | ICD-10-CM | POA: Diagnosis not present

## 2018-06-29 MED ORDER — IBUPROFEN 600 MG PO TABS: 600.0000 mg | ORAL_TABLET | Freq: Three times a day (TID) | ORAL | 1 refills | Status: DC | PRN

## 2018-06-29 MED ORDER — TIZANIDINE HCL 4 MG PO TABS: 4.0000 mg | ORAL_TABLET | Freq: Every evening | ORAL | 2 refills | Status: AC

## 2018-06-29 NOTE — Assessment & Plan Note (Signed)
Healed at this time.  No restrictions.  Continue home exercises and icing regimen.  Follow-up with me again in 4 to 6 weeks if pain is not completely resolved

## 2018-06-29 NOTE — Patient Instructions (Addendum)
Good to see you as always Likely a virus and will take a little time Ibuprofen 600mg  3 times a day for 3 days then as needed zanaflex at night If not better call us [phone] and I will se eyou again  Otherwise see me again in 6-8 weeks

## 2018-06-29 NOTE — Progress Notes (Signed)
[name] [address], [place] [address] Phone: [phone] Subjective:     CC: Elbow pain and back pain follow-up  HER:[id number]  Gina [name] is a 52 y.o. female coming in with complaint of elbow pain. Elbow is 98% better. Has right side back pain today.  No numbness and tingling noted.   Onset- 2 weeks  Location- Hip Duration- Consistent pain all day Character- Sharp Aggravating factors- laying on her right side Reliving factors- Stretching  Therapies tried-been active and stretching mostly Severity-5 out of 10     Past Medical History:  Diagnosis Date  . Acute meniscal tear of knee   . Allergy   . Migraine    History reviewed. No pertinent surgical history. Social History   Socioeconomic History  . Marital status: Significant Other    Spouse name: Not on file  . Number of children: Not on file  . Years of education: Not on file  . Highest education level: Not on file  Occupational History  . Not on file  Social Needs  . Financial resource strain: Not on file  . Food insecurity:    Worry: Not on file    Inability: Not on file  . Transportation needs:    Medical: Not on file    Non-medical: Not on file  Tobacco Use  . Smoking status: Never Smoker  . Smokeless tobacco: Never Used  Substance and Sexual Activity  . Alcohol use: Yes  . Drug use: No  . Sexual activity: Yes    Comment: with females  Lifestyle  . Physical activity:    Days per week: Not on file    Minutes per session: Not on file  . Stress: Not on file  Relationships  . Social connections:    Talks on phone: Not on file    Gets together: Not on file    Attends religious service: Not on file    Active member of club or organization: Not on file    Attends meetings of clubs or organizations: Not on file    Relationship status: Not on file  Other Topics Concern  . Not on file  Social History Narrative  . Not on file   No Known  Allergies History reviewed. No pertinent family history.  No family history   Past medical history, social, surgical and family history all reviewed in electronic medical record.  No pertanent information unless stated regarding to the chief complaint.   Review of Systems:Review of systems updated and as accurate as of 06/29/18  No headache, visual changes, nausea, vomiting, diarrhea, constipation, dizziness, abdominal pain, skin rash, fevers, chills, night sweats, weight loss, swollen lymph nodes, body aches, joint swelling, , chest pain, shortness of breath, mood changes.  Positive muscle aches  Objective  Blood pressure 140/80, pulse 72, height 5\' 6"  (1.676 m), weight 135 lb (61.2 kg), SpO2 98 %. Systems examined below as of 06/29/18   General: No apparent distress alert and oriented x3 mood and affect normal, dressed appropriately.  HEENT: Pupils equal, extraocular movements intact  Respiratory: Patient's speak in full sentences and does not appear short of breath  Cardiovascular: No lower extremity edema, non tender, no erythema  Skin: Warm dry intact with no signs of infection or rash on extremities or on axial skeleton.  Abdomen: Soft nontender  Neuro: Cranial nerves II through XII are intact, neurovascularly intact in all extremities with 2+ DTRs and 2+ pulses.  Lymph: No lymphadenopathy of  posterior or anterior cervical chain or axillae bilaterally.  Gait normal with good balance and coordination.  MSK:  Non tender with full range of motion and good stability and symmetric strength and tone of shoulders,  wrist, hip, knee and ankles bilaterally.  Elbow: Right Unremarkable to inspection. Range of motion full pronation, supination, flexion, extension. Strength is full to all of the above directions Stable to varus, valgus stress. Negative moving valgus stress test. No discrete areas of tenderness to palpation. Ulnar nerve does not sublux. Negative cubital tunnel  Tinel's. Contralateral elbow unremarkable  Back Exam:  Inspection: Unremarkable  Motion: Flexion 45 deg, Extension 25 deg, Side Bending to 35 deg bilaterally,  Rotation to 45 deg bilaterally  SLR laying: Negative  XSLR laying: Negative  Palpable tenderness: Tender to palpation in the paraspinal musculature mostly in the thoracolumbar and somewhat in the lumbosacral area right greater than left. FABER: Positive right. Sensory change: Gross sensation intact to all lumbar and sacral dermatomes.  Reflexes: 2+ at both patellar tendons, 2+ at achilles tendons, Babinski's downgoing.  Strength at foot  Plantar-flexion: 5/5 Dorsi-flexion: 5/5 Eversion: 5/5 Inversion: 5/5  Leg strength  Quad: 5/5 Hamstring: 5/5 Hip flexor: 5/5 Hip abductors: 4/5 but symmetric Gait unremarkable.  Osteopathic findings C2 flexed rotated and side bent right C4 flexed rotated and side bent left T3 extended rotated and side bent right inhaled third rib T6 extended rotated and side bent left L2 flexed rotated and side bent right Sacrum right on right    Impression and Recommendations:     This case required medical decision making of moderate complexity.      Note: This dictation was prepared with Dragon dictation along with smaller phrase technology. Any transcriptional errors that result from this process are unintentional.

## 2018-06-29 NOTE — Assessment & Plan Note (Signed)
Decision today to treat with OMT was based on Physical Exam  After verbal consent patient was treated with HVLA, ME, FPR techniques in cervical, thoracic, lumbar and sacral areas  Patient tolerated the procedure well with improvement in symptoms  Patient given exercises, stretches and lifestyle modifications  See medications in patient instructions if given  Patient will follow up in 4 weeks 

## 2018-06-29 NOTE — Assessment & Plan Note (Signed)
Patient has tightness in the neck.  Discussed icing regimen and home exercises.  Discussed which activities to do which wants to avoid.  We discussed over-the-counter medications.  Posture and ergonomics throughout the day.  Follow-up again in 4 to 8 weeks

## 2018-07-06 DIAGNOSIS — Z1211 Encounter for screening for malignant neoplasm of colon: Secondary | ICD-10-CM | POA: Diagnosis not present

## 2018-07-06 DIAGNOSIS — D509 Iron deficiency anemia, unspecified: Secondary | ICD-10-CM | POA: Diagnosis not present

## 2018-07-07 DIAGNOSIS — Z1231 Encounter for screening mammogram for malignant neoplasm of breast: Secondary | ICD-10-CM | POA: Diagnosis not present

## 2018-08-22 DIAGNOSIS — F331 Major depressive disorder, recurrent, moderate: Secondary | ICD-10-CM | POA: Diagnosis not present

## 2018-08-22 DIAGNOSIS — F419 Anxiety disorder, unspecified: Secondary | ICD-10-CM | POA: Diagnosis not present

## 2018-09-11 ENCOUNTER — Telehealth: Payer: Self-pay

## 2018-09-11 NOTE — Progress Notes (Signed)
[name] [address], [place] [address] Phone: [phone] Subjective:    I [name] am serving as a Education administrator for Dr. Hulan Saas.   I'm seeing this patient by the request  of:   Hayden Rasmussen, MD   CC: Right hip pain  UYQ:[id number]  Gina [name] is a 52 y.o. female coming in with complaint of right leg pain. Issues from the hip to her ankle. States that it feels like bone pain. No numbness and tingling noted. No loss of ROM but it is hard to stretch.   Onset- 6 weeks  Location- Glut./GT Duration-  Character- Sharp Aggravating factors- Walking on concrete Reliving factors-  Therapies tried- Ice, heat, topical, oral Severity-     Past Medical History:  Diagnosis Date  . Acute meniscal tear of knee   . Allergy   . Migraine    No past surgical history on file. Social History   Socioeconomic History  . Marital status: Significant Other    Spouse name: Not on file  . Number of children: Not on file  . Years of education: Not on file  . Highest education level: Not on file  Occupational History  . Not on file  Social Needs  . Financial resource strain: Not on file  . Food insecurity:    Worry: Not on file    Inability: Not on file  . Transportation needs:    Medical: Not on file    Non-medical: Not on file  Tobacco Use  . Smoking status: Never Smoker  . Smokeless tobacco: Never Used  Substance and Sexual Activity  . Alcohol use: Yes  . Drug use: No  . Sexual activity: Yes    Comment: with females  Lifestyle  . Physical activity:    Days per week: Not on file    Minutes per session: Not on file  . Stress: Not on file  Relationships  . Social connections:    Talks on phone: Not on file    Gets together: Not on file    Attends religious service: Not on file    Active member of club or organization: Not on file    Attends meetings of clubs or organizations: Not on file    Relationship status: Not  on file  Other Topics Concern  . Not on file  Social History Narrative  . Not on file   No Known Allergies No family history on file.   Current Outpatient Medications (Cardiovascular):  .  nitroGLYCERIN (NITRODUR - DOSED IN MG/24 HR) 0.2 mg/hr patch, 1/4 patch daily   Current Outpatient Medications (Analgesics):  .  ibuprofen (ADVIL,MOTRIN) 600 MG tablet, Take 1 tablet (600 mg total) by mouth every 8 (eight) hours as needed.   Current Outpatient Medications (Other):  .  LamoTRIgine (LAMICTAL XR) 50 MG TB24 24 hour tablet, Take 1 tablet (50 mg total) by mouth daily. .  diazepam (VALIUM) 5 MG tablet, Take 1 tablet (5 mg total) by mouth every 12 (twelve) hours as needed. One tab by mouth, 2 hours before procedure. (Patient not taking: Reported on 09/12/2018) .  Vitamin D, Ergocalciferol, (DRISDOL) 50000 units CAPS capsule, Take 1 capsule (50,000 Units total) by mouth every 7 (seven) days.    Past medical history, social, surgical and family history all reviewed in electronic medical record.  No pertanent information unless stated regarding to the chief complaint.   Review of Systems:  No headache, visual changes, nausea, vomiting,  diarrhea, constipation, dizziness, abdominal pain, skin rash, fevers, chills, night sweats, weight loss, swollen lymph nodes, body aches, joint swelling, muscle aches, chest pain, shortness of breath, mood changes.   Objective  Blood pressure 110/70, pulse 62, height 5\' 6"  (1.676 m), weight 137 lb (62.1 kg), SpO2 99 %. \   General: No apparent distress alert and oriented x3 mood and affect normal, dressed appropriately.  HEENT: Pupils equal, extraocular movements intact  Respiratory: Patient's speak in full sentences and does not appear short of breath  Cardiovascular: No lower extremity edema, non tender, no erythema  Skin: Warm dry intact with no signs of infection or rash on extremities or on axial skeleton.  Abdomen: Soft nontender  Neuro: Cranial  nerves II through XII are intact, neurovascularly intact in all extremities with 2+ DTRs and 2+ pulses.  Lymph: No lymphadenopathy of posterior or anterior cervical chain or axillae bilaterally.  Gait normal with good balance and coordination.  MSK:  Non tender with full range of motion and good stability and symmetric strength and tone of shoulders, elbows, wrist,  knee and ankles bilaterally.  Right hip exam shows the patient has full range of motion.  Positive Faber with severe pain over the lateral aspect of the knee.  Negative straight leg test.  Minimal discomfort over the right sacroiliac joint.  Patient has 5 out of 5 strength.  Limited muscular skeletal ultrasound was performed and interpreted by [name]  Limited ultrasound of patient's right GT area does show significant calcific bursitis noted.  No abnormal vascularity noted.  Patient does have some cortical irregularity of the lateral aspect.  No true muscle tear appreciated.   Procedure: Real-time Ultrasound Guided Injection of right greater trochanteric bursitis secondary to patient's body habitus Device: GE Logiq Q7 Ultrasound guided injection is preferred based studies that show increased duration, increased effect, greater accuracy, decreased procedural pain, increased response rate, and decreased cost with ultrasound guided versus blind injection.  Verbal informed consent obtained.  Time-out conducted.  Noted no overlying erythema, induration, or other signs of local infection.  Skin prepped in a sterile fashion.  Local anesthesia: Topical Ethyl chloride.  With sterile technique and under real time ultrasound guidance:  Greater trochanteric area was visualized and patient's bursa was noted. A 22-gauge 3 inch needle was inserted and 4 cc of 0.5% Marcaine and 1 cc of Kenalog 40 mg/dL was injected. Pictures taken Completed without difficulty  Pain immediately resolved suggesting accurate placement of the medication.   Advised to call if fevers/chills, erythema, induration, drainage, or persistent bleeding.  Images permanently stored and available for review in the ultrasound unit.  Impression: Technically successful ultrasound guided injection.   Impression and Recommendations:     This case required medical decision making of moderate complexity. The above documentation has been reviewed and is accurate and complete [name], DO       Note: This dictation was prepared with Dragon dictation along with smaller phrase technology. Any transcriptional errors that result from this process are unintentional.

## 2018-09-11 NOTE — Telephone Encounter (Signed)
Copied from [place] [id number]. Topic: Appointment Scheduling - Scheduling Inquiry for Clinic >> Sep 11, 2018 10:46 AM Sheran Luz wrote: Reason for CRM: Pt called requesting to be worked in with Dr. Tamala Julian this week for leg pain, in the morning, if possible. Please contact pt to let her know when she can be scheduled.

## 2018-09-11 NOTE — Telephone Encounter (Signed)
Spoke to pt, scheduled her 09/12/18 @ 745am.

## 2018-09-12 ENCOUNTER — Ambulatory Visit: Payer: BLUE CROSS/BLUE SHIELD | Admitting: Family Medicine

## 2018-09-12 ENCOUNTER — Encounter: Payer: Self-pay | Admitting: Family Medicine

## 2018-09-12 ENCOUNTER — Ambulatory Visit: Payer: Self-pay

## 2018-09-12 VITALS — BP 110/70 | HR 62 | Ht 66.0 in | Wt 137.0 lb

## 2018-09-12 DIAGNOSIS — M7061 Trochanteric bursitis, right hip: Secondary | ICD-10-CM | POA: Insufficient documentation

## 2018-09-12 DIAGNOSIS — M25551 Pain in right hip: Secondary | ICD-10-CM | POA: Diagnosis not present

## 2018-09-12 MED ORDER — VITAMIN D (ERGOCALCIFEROL) 1.25 MG (50000 UNIT) PO CAPS: 50000.0000 [IU] | ORAL_CAPSULE | ORAL | 0 refills | Status: DC

## 2018-09-12 NOTE — Assessment & Plan Note (Signed)
Patient given injection.  Discussed with patient icing regimen, home exercise, topical anti-inflammatories.  Patient is very active.  Was sent to physical therapy to try some other modalities that could also be beneficial.  Did have calcific changes and put on once weekly vitamin D.  Follow-up again in 4 weeks

## 2018-09-12 NOTE — Patient Instructions (Signed)
Good to see you  Injected the hip GT today  Ice 20 minutes 2 times daily. Usually after activity and before bed. pennsaid pinkie amount topically 2 times daily as needed.  PT will be calling you  Exercises 3 times a week.  Flat shoes may be better  Less impact with working out  See me again in 4ish weeks

## 2018-09-20 DIAGNOSIS — R262 Difficulty in walking, not elsewhere classified: Secondary | ICD-10-CM | POA: Diagnosis not present

## 2018-09-20 DIAGNOSIS — M79604 Pain in right leg: Secondary | ICD-10-CM | POA: Diagnosis not present

## 2018-09-20 DIAGNOSIS — M6281 Muscle weakness (generalized): Secondary | ICD-10-CM | POA: Diagnosis not present

## 2018-09-20 DIAGNOSIS — M25551 Pain in right hip: Secondary | ICD-10-CM | POA: Diagnosis not present

## 2018-09-25 DIAGNOSIS — M6281 Muscle weakness (generalized): Secondary | ICD-10-CM | POA: Diagnosis not present

## 2018-09-25 DIAGNOSIS — R262 Difficulty in walking, not elsewhere classified: Secondary | ICD-10-CM | POA: Diagnosis not present

## 2018-09-25 DIAGNOSIS — M79604 Pain in right leg: Secondary | ICD-10-CM | POA: Diagnosis not present

## 2018-09-25 DIAGNOSIS — M25551 Pain in right hip: Secondary | ICD-10-CM | POA: Diagnosis not present

## 2018-10-03 DIAGNOSIS — M25551 Pain in right hip: Secondary | ICD-10-CM | POA: Diagnosis not present

## 2018-10-03 DIAGNOSIS — M79604 Pain in right leg: Secondary | ICD-10-CM | POA: Diagnosis not present

## 2018-10-03 DIAGNOSIS — R262 Difficulty in walking, not elsewhere classified: Secondary | ICD-10-CM | POA: Diagnosis not present

## 2018-10-03 DIAGNOSIS — M6281 Muscle weakness (generalized): Secondary | ICD-10-CM | POA: Diagnosis not present

## 2018-10-06 ENCOUNTER — Telehealth: Payer: Self-pay | Admitting: Hematology and Oncology

## 2018-10-06 ENCOUNTER — Other Ambulatory Visit: Payer: Self-pay

## 2018-10-06 ENCOUNTER — Telehealth: Payer: Self-pay

## 2018-10-06 DIAGNOSIS — D509 Iron deficiency anemia, unspecified: Secondary | ICD-10-CM

## 2018-10-06 NOTE — Telephone Encounter (Signed)
Called regarding 12/18

## 2018-10-06 NOTE — Telephone Encounter (Signed)
Returned patient's call regarding rescheduling follow up appointment with Dr. Lindi Adie.  Patient informed by nurse that scheduling message will be sent.  Order placed for labs prior to seeing Dr. Lindi Adie.  No further needs at this time.

## 2018-10-10 DIAGNOSIS — M25551 Pain in right hip: Secondary | ICD-10-CM | POA: Diagnosis not present

## 2018-10-10 DIAGNOSIS — M79604 Pain in right leg: Secondary | ICD-10-CM | POA: Diagnosis not present

## 2018-10-10 DIAGNOSIS — R262 Difficulty in walking, not elsewhere classified: Secondary | ICD-10-CM | POA: Diagnosis not present

## 2018-10-10 DIAGNOSIS — M6281 Muscle weakness (generalized): Secondary | ICD-10-CM | POA: Diagnosis not present

## 2018-10-13 DIAGNOSIS — R262 Difficulty in walking, not elsewhere classified: Secondary | ICD-10-CM | POA: Diagnosis not present

## 2018-10-13 DIAGNOSIS — M25551 Pain in right hip: Secondary | ICD-10-CM | POA: Diagnosis not present

## 2018-10-13 DIAGNOSIS — M6281 Muscle weakness (generalized): Secondary | ICD-10-CM | POA: Diagnosis not present

## 2018-10-13 DIAGNOSIS — M79604 Pain in right leg: Secondary | ICD-10-CM | POA: Diagnosis not present

## 2018-10-23 DIAGNOSIS — D120 Benign neoplasm of cecum: Secondary | ICD-10-CM | POA: Diagnosis not present

## 2018-10-23 DIAGNOSIS — K635 Polyp of colon: Secondary | ICD-10-CM | POA: Diagnosis not present

## 2018-10-23 DIAGNOSIS — Z1211 Encounter for screening for malignant neoplasm of colon: Secondary | ICD-10-CM | POA: Diagnosis not present

## 2018-10-23 DIAGNOSIS — K6389 Other specified diseases of intestine: Secondary | ICD-10-CM | POA: Diagnosis not present

## 2018-10-25 ENCOUNTER — Inpatient Hospital Stay: Payer: BLUE CROSS/BLUE SHIELD | Attending: Hematology and Oncology

## 2018-10-25 DIAGNOSIS — D509 Iron deficiency anemia, unspecified: Secondary | ICD-10-CM | POA: Diagnosis present

## 2018-10-25 LAB — CMP (CANCER CENTER ONLY)
ALT: 20 U/L (ref 0–44)
ANION GAP: 8 (ref 5–15)
AST: 21 U/L (ref 15–41)
Albumin: 3.9 g/dL (ref 3.5–5.0)
Alkaline Phosphatase: 45 U/L (ref 38–126)
BILIRUBIN TOTAL: 0.4 mg/dL (ref 0.3–1.2)
BUN: 9 mg/dL (ref 6–20)
CO2: 26 mmol/L (ref 22–32)
Calcium: 8.9 mg/dL (ref 8.9–10.3)
Chloride: 105 mmol/L (ref 98–111)
Creatinine: 0.75 mg/dL (ref 0.44–1.00)
GFR, Est AFR Am: >60 mL/min (ref >60)
Glucose, Bld: 97 mg/dL (ref 70–99)
POTASSIUM: 4.5 mmol/L (ref 3.5–5.1)
Sodium: 139 mmol/L (ref 135–145)
TOTAL PROTEIN: 6.6 g/dL (ref 6.5–8.1)

## 2018-10-25 LAB — CBC WITH DIFFERENTIAL (CANCER CENTER ONLY)
Abs Immature Granulocytes: 0.01 10*3/uL (ref 0.00–0.07)
Basophils Absolute: 0 10*3/uL (ref 0.0–0.1)
Basophils Relative: 0 %
Eosinophils Absolute: 0.1 10*3/uL (ref 0.0–0.5)
Eosinophils Relative: 1 %
HCT: 37.5 % (ref 36.0–46.0)
HEMOGLOBIN: 12.8 g/dL (ref 12.0–15.0)
Immature Granulocytes: 0 %
Lymphocytes Relative: 26 %
Lymphs Abs: 1.2 10*3/uL (ref 0.7–4.0)
MCH: 32.2 pg (ref 26.0–34.0)
MCHC: 34.1 g/dL (ref 30.0–36.0)
MCV: 94.5 fL (ref 80.0–100.0)
Monocytes Absolute: 0.6 10*3/uL (ref 0.1–1.0)
Monocytes Relative: 12 %
Neutro Abs: 2.9 10*3/uL (ref 1.7–7.7)
Neutrophils Relative %: 61 %
Platelet Count: 296 10*3/uL (ref 150–400)
RBC: 3.97 MIL/uL (ref 3.87–5.11)
RDW: 11.9 % (ref 11.5–15.5)
WBC Count: 4.8 10*3/uL (ref 4.0–10.5)
nRBC: 0 % (ref 0.0–0.2)

## 2018-10-25 LAB — IRON AND TIBC
Iron: 88 ug/dL (ref 41–142)
Saturation Ratios: 31 % (ref 21–57)
TIBC: 286 ug/dL (ref 236–444)
UIBC: 199 ug/dL (ref 120–384)

## 2018-10-25 LAB — FERRITIN: Ferritin: 50 ng/mL (ref 11–307)

## 2018-10-26 NOTE — Assessment & Plan Note (Signed)
Iron deficiency anemia CBC differential on 12/20/2017: Hemoglobin 6.1, MCV 78.9, platelets 526, ferritin 2.7 Iron deficiency anemia: I discussed with the patient the process of iron absorption. I counseled extensively regarding the different causes of iron deficiency including blood loss and malabsorption. I recommended that the patient see Gastroenterology.  She will think about it and let me know.  It is possible that the patient may still have an occult source of bleeding.  However malabsorption is also a possibility Lab review: Hb 12.8, ferritin: 50 No further need for IV Iron  RTC in 3 months to recheck labs and follow up

## 2018-10-27 ENCOUNTER — Inpatient Hospital Stay (HOSPITAL_BASED_OUTPATIENT_CLINIC_OR_DEPARTMENT_OTHER): Payer: BLUE CROSS/BLUE SHIELD | Admitting: Hematology and Oncology

## 2018-10-27 ENCOUNTER — Other Ambulatory Visit: Payer: BLUE CROSS/BLUE SHIELD

## 2018-10-27 DIAGNOSIS — D509 Iron deficiency anemia, unspecified: Secondary | ICD-10-CM | POA: Diagnosis not present

## 2018-10-27 NOTE — Progress Notes (Signed)
Patient Care Team: Hayden Rasmussen, MD as PCP - General (Family Medicine)  DIAGNOSIS:  Encounter Diagnosis  Name Primary?  . Iron deficiency anemia, unspecified iron deficiency anemia type    CHIEF COMPLIANT: Follow-up of iron deficiency anemia, previous IV iron was in March 2019  INTERVAL HISTORY: Gina [name] is a 52 year old with above-mentioned history of iron deficiency anemia who received IV iron in March and is here for routine follow-up.  She has been feeling excellent.  Denies any problems or concerns.  Denies any fatigue shortness of breath lightheadedness or dizziness.  REVIEW OF SYSTEMS:   Constitutional: Denies fevers, chills or abnormal weight loss Eyes: Denies blurriness of vision Ears, nose, mouth, throat, and face: Denies mucositis or sore throat Respiratory: Denies cough, dyspnea or wheezes Cardiovascular: Denies palpitation, chest discomfort Gastrointestinal:  Denies nausea, heartburn or change in bowel habits Skin: Denies abnormal skin rashes Lymphatics: Denies new lymphadenopathy or easy bruising Neurological:Denies numbness, tingling or new weaknesses Behavioral/Psych: Mood is stable, no new changes  Extremities: No lower extremity edema   All other systems were reviewed with the patient and are negative.  I have reviewed the past medical history, past surgical history, social history and family history with the patient and they are unchanged from previous note.  ALLERGIES:  has No Known Allergies.  MEDICATIONS:  Current Outpatient Medications  Medication Sig Dispense Refill  . diazepam (VALIUM) 5 MG tablet Take 1 tablet (5 mg total) by mouth every 12 (twelve) hours as needed. One tab by mouth, 2 hours before procedure. (Patient not taking: Reported on 09/12/2018) 10 tablet 0  . ibuprofen (ADVIL,MOTRIN) 600 MG tablet Take 1 tablet (600 mg total) by mouth every 8 (eight) hours as needed. 90 tablet 1  . LamoTRIgine (LAMICTAL XR) 50 MG TB24 24 hour  tablet Take 1 tablet (50 mg total) by mouth daily. 30 tablet   . nitroGLYCERIN (NITRODUR - DOSED IN MG/24 HR) 0.2 mg/hr patch 1/4 patch daily 30 patch 1  . Vitamin D, Ergocalciferol, (DRISDOL) 50000 units CAPS capsule Take 1 capsule (50,000 Units total) by mouth every 7 (seven) days. 12 capsule 0   No current facility-administered medications for this visit.     PHYSICAL EXAMINATION: ECOG PERFORMANCE STATUS: 0 - Asymptomatic  Vitals:   10/27/18 0954  BP: (!) 144/88  Pulse: 65  Resp: 17  Temp: 98.2 F (36.8 C)  SpO2: 100%   Filed Weights   10/27/18 0954  Weight: 135 lb 9.6 oz (61.5 kg)    GENERAL:alert, no distress and comfortable SKIN: skin color, texture, turgor are normal, no rashes or significant lesions EYES: normal, Conjunctiva are pink and non-injected, sclera clear OROPHARYNX:no exudate, no erythema and lips, buccal mucosa, and tongue normal  NECK: supple, thyroid normal size, non-tender, without nodularity LYMPH:  no palpable lymphadenopathy in the cervical, axillary or inguinal LUNGS: clear to auscultation and percussion with normal breathing effort HEART: regular rate & rhythm and no murmurs and no lower extremity edema ABDOMEN:abdomen soft, non-tender and normal bowel sounds MUSCULOSKELETAL:no cyanosis of digits and no clubbing  NEURO: alert & oriented x 3 with fluent speech, no focal motor/sensory deficits EXTREMITIES: No lower extremity edema  LABORATORY DATA:  I have reviewed the data as listed CMP Latest Ref Rng & Units 10/25/2018  Glucose 70 - 99 mg/dL 97  BUN 6 - 20 mg/dL 9  Creatinine 0.44 - 1.00 mg/dL 0.75  Sodium 135 - 145 mmol/L 139  Potassium 3.5 - 5.1 mmol/L 4.5  Chloride 98 -  111 mmol/L 105  CO2 22 - 32 mmol/L 26  Calcium 8.9 - 10.3 mg/dL 8.9  Total Protein 6.5 - 8.1 g/dL 6.6  Total Bilirubin 0.3 - 1.2 mg/dL 0.4  Alkaline Phos 38 - 126 U/L 45  AST 15 - 41 U/L 21  ALT 0 - 44 U/L 20    Lab Results  Component Value Date   WBC 4.8  10/25/2018   HGB 12.8 10/25/2018   HCT 37.5 10/25/2018   MCV 94.5 10/25/2018   PLT 296 10/25/2018   NEUTROABS 2.9 10/25/2018    ASSESSMENT & PLAN:  Iron deficiency anemia Iron deficiency anemia CBC differential on 12/20/2017: Hemoglobin 6.1, MCV 78.9, platelets 526, ferritin 2.7 Differential diagnosis: Occult GI bleed versus malabsorption  Lab review 10/25/2018: Hb 12.8, ferritin: 50, iron saturation 30% No further need for IV Iron  RTC on an as-needed basis.  No orders of the defined types were placed in this encounter.  The patient has a good understanding of the overall plan. she agrees with it. she will call with any problems that may develop before the next visit here.   Harriette Ohara, MD 10/27/18

## 2018-10-30 ENCOUNTER — Telehealth: Payer: Self-pay | Admitting: Hematology and Oncology

## 2018-10-30 NOTE — Telephone Encounter (Signed)
Per 12/20 no los 

## 2018-11-05 NOTE — Progress Notes (Signed)
[name] [place],  [address] Phone: [phone] Subjective:    I Gina [name] am serving as a Education administrator for Dr. Hulan Saas.    CC: Right hip pain follow-up  [id number]  Gina [name] is a 52 y.o. female coming in with complaint of right hip pain.  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  Given injection September 12, 2017.  Patient states the hip is feeling much better. Still has some pain in the hamstring but PT is helping a lot.        Past Medical History:  Diagnosis Date  . Acute meniscal tear of knee   . Allergy   . Migraine    No past surgical history on file. Social History   Socioeconomic History  . Marital status: Significant Other    Spouse name: Not on file  . Number of children: Not on file  . Years of education: Not on file  . Highest education level: Not on file  Occupational History  . Not on file  Social Needs  . Financial resource strain: Not on file  . Food insecurity:    Worry: Not on file    Inability: Not on file  . Transportation needs:    Medical: Not on file    Non-medical: Not on file  Tobacco Use  . Smoking status: Never Smoker  . Smokeless tobacco: Never Used  Substance and Sexual Activity  . Alcohol use: Yes  . Drug use: No  . Sexual activity: Yes    Comment: with females  Lifestyle  . Physical activity:    Days per week: Not on file    Minutes per session: Not on file  . Stress: Not on file  Relationships  . Social connections:    Talks on phone: Not on file    Gets together: Not on file    Attends religious service: Not on file    Active member of club or organization: Not on file    Attends meetings of clubs or organizations: Not on file    Relationship status: Not on file  Other Topics Concern  . Not on file  Social History Narrative  . Not on file   No Known Allergies No family history on  file.     Current Outpatient Medications (Analgesics):  .  ibuprofen (ADVIL,MOTRIN) 600 MG tablet, Take 1 tablet (600 mg total) by mouth every 8 (eight) hours as needed.   Current Outpatient Medications (Other):  .  diazepam (VALIUM) 5 MG tablet, Take 1 tablet (5 mg total) by mouth every 12 (twelve) hours as needed. One tab by mouth, 2 hours before procedure. .  LamoTRIgine (LAMICTAL XR) 50 MG TB24 24 hour tablet, Take 1 tablet (50 mg total) by mouth daily. .  Vitamin D, Ergocalciferol, (DRISDOL) 50000 units CAPS capsule, Take 1 capsule (50,000 Units total) by mouth every 7 (seven) days.    Past medical history, social, surgical and family history all reviewed in electronic medical record.  No pertanent information unless stated regarding to the chief complaint.   Review of Systems:  No headache, visual changes, nausea, vomiting, diarrhea, constipation, dizziness, abdominal pain, skin rash, fevers, chills, night sweats, weight loss, swollen lymph nodes, body aches, joint swelling,  chest pain, shortness of breath, mood changes.  Positive muscle aches  Objective  Blood pressure 100/70, pulse 74, height 5\' 6"  (1.676 m), weight 134 lb (60.8 kg), SpO2 98 %.  General: No apparent distress alert and oriented x3 mood and affect normal, dressed appropriately.  HEENT: Pupils equal, extraocular movements intact  Respiratory: Patient's speak in full sentences and does not appear short of breath  Cardiovascular: No lower extremity edema, non tender, no erythema  Skin: Warm dry intact with no signs of infection or rash on extremities or on axial skeleton.  Abdomen: Soft nontender  Neuro: Cranial nerves II through XII are intact, neurovascularly intact in all extremities with 2+ DTRs and 2+ pulses.  Lymph: No lymphadenopathy of posterior or anterior cervical chain or axillae bilaterally.  Gait normal with good balance and coordination.  MSK:  Non tender with full range of motion and good  stability and symmetric strength and tone of shoulders, elbows, wrist,  knee and ankles bilaterally.  Hip: Left ROM IR: 45 Deg, ER: 25 Deg, Flexion: 120 Deg, Extension: 100 Deg, Abduction: 45 Deg, Adduction: 15 Deg Strength IR: 5/5, ER: 5/5, Flexion: 5/5, Extension: 5/5, Abduction: 5/5, Adduction: 5/5 Pelvic alignment unremarkable to inspection and palpation. Standing hip rotation and gait without trendelenburg sign / unsteadiness. Mild tenderness to palpation over the lateral greater trochanteric area Left SI joint mild tenderness  Osteopathic findings C2 flexed rotated and side bent right C6 flexed rotated and side bent left T3 extended rotated and side bent right inhaled third rib T9 extended rotated and side bent left L2 flexed rotated and side bent right Sacrum left on left    Impression and Recommendations:     This case required medical decision making of moderate complexity. The above documentation has been reviewed and is accurate and complete [name], DO       Note: This dictation was prepared with Dragon dictation along with smaller phrase technology. Any transcriptional errors that result from this process are unintentional.

## 2018-11-06 ENCOUNTER — Encounter: Payer: Self-pay | Admitting: Family Medicine

## 2018-11-06 ENCOUNTER — Ambulatory Visit: Payer: BLUE CROSS/BLUE SHIELD | Admitting: Family Medicine

## 2018-11-06 VITALS — BP 100/70 | HR 74 | Ht 66.0 in | Wt 134.0 lb

## 2018-11-06 DIAGNOSIS — M999 Biomechanical lesion, unspecified: Secondary | ICD-10-CM | POA: Diagnosis not present

## 2018-11-06 DIAGNOSIS — M7061 Trochanteric bursitis, right hip: Secondary | ICD-10-CM

## 2018-11-06 NOTE — Assessment & Plan Note (Signed)
More pain over the left side today.  We will monitor closely though.  Patient has been doing relatively well.  Discussed icing regimen and home exercise.  Discussed which activities to doing which wants to avoid.  Patient has done well and will follow-up again in 6 to 8 weeks

## 2018-11-06 NOTE — Assessment & Plan Note (Signed)
Decision today to treat with OMT was based on Physical Exam  After verbal consent patient was treated with HVLA, ME, FPR techniques in cervical, thoracic, lumbar and sacral areas  Patient tolerated the procedure well with improvement in symptoms  Patient given exercises, stretches and lifestyle modifications  See medications in patient instructions if given  Patient will follow up in 4-8 weeks 

## 2018-11-06 NOTE — Patient Instructions (Signed)
[phone] That is there new number See me again in 6-8 weeks

## 2018-11-27 ENCOUNTER — Ambulatory Visit: Payer: BLUE CROSS/BLUE SHIELD | Admitting: Family Medicine

## 2018-11-27 ENCOUNTER — Encounter: Payer: Self-pay | Admitting: Family Medicine

## 2018-11-27 VITALS — BP 122/80 | HR 58 | Ht 66.0 in | Wt 134.0 lb

## 2018-11-27 DIAGNOSIS — M999 Biomechanical lesion, unspecified: Secondary | ICD-10-CM

## 2018-11-27 DIAGNOSIS — M542 Cervicalgia: Secondary | ICD-10-CM | POA: Diagnosis not present

## 2018-11-27 NOTE — Assessment & Plan Note (Signed)
Tightness on the occiput noted.  Discussed icing regimen and home exercise.  Discussed which activities to do which wants to avoid.  Patient is lifting a lot of boxes in the process of moving as well as painting.  We discussed ergonomics and can be beneficial.  Follow-up again in 4 to 6 weeks

## 2018-11-27 NOTE — Assessment & Plan Note (Signed)
Decision today to treat with OMT was based on Physical Exam  After verbal consent patient was treated with HVLA, ME, FPR techniques in cervical, thoracic, lumbar and areas  Patient tolerated the procedure well with improvement in symptoms  Patient given exercises, stretches and lifestyle modifications  See medications in patient instructions if given  Patient will follow up in 4 weeks

## 2018-11-27 NOTE — Progress Notes (Signed)
[name] [place], [place] [address] Phone: [phone] Subjective:     CC: Neck pain follow-up  [id number]  Increased neck pain with moving  Patient is here today for neck pain. She has been seen previously for manipulation to treat her neck and back pain. She has been moving from one house to another. Feels that she did something to her neck during the move. Pain has been occurring for 2 days. No radiating symptoms.  Patient states that it is tightness more than thing else.  Difficult to move head in all planes little bit.  Patient denies of any significant headache associated with it.     Past Medical History:  Diagnosis Date  . Acute meniscal tear of knee   . Allergy   . Migraine    No past surgical history on file. Social History   Socioeconomic History  . Marital status: Significant Other    Spouse name: Not on file  . Number of children: Not on file  . Years of education: Not on file  . Highest education level: Not on file  Occupational History  . Not on file  Social Needs  . Financial resource strain: Not on file  . Food insecurity:    Worry: Not on file    Inability: Not on file  . Transportation needs:    Medical: Not on file    Non-medical: Not on file  Tobacco Use  . Smoking status: Never Smoker  . Smokeless tobacco: Never Used  Substance and Sexual Activity  . Alcohol use: Yes  . Drug use: No  . Sexual activity: Yes    Comment: with females  Lifestyle  . Physical activity:    Days per week: Not on file    Minutes per session: Not on file  . Stress: Not on file  Relationships  . Social connections:    Talks on phone: Not on file    Gets together: Not on file    Attends religious service: Not on file    Active member of club or organization: Not on file    Attends meetings of clubs or organizations: Not on file    Relationship status: Not on file  Other Topics Concern  . Not on file  Social  History Narrative  . Not on file   No Known Allergies No family history on file.     Current Outpatient Medications (Analgesics):  .  ibuprofen (ADVIL,MOTRIN) 600 MG tablet, Take 1 tablet (600 mg total) by mouth every 8 (eight) hours as needed.   Current Outpatient Medications (Other):  .  diazepam (VALIUM) 5 MG tablet, Take 1 tablet (5 mg total) by mouth every 12 (twelve) hours as needed. One tab by mouth, 2 hours before procedure. .  LamoTRIgine (LAMICTAL XR) 50 MG TB24 24 hour tablet, Take 1 tablet (50 mg total) by mouth daily. .  Vitamin D, Ergocalciferol, (DRISDOL) 50000 units CAPS capsule, Take 1 capsule (50,000 Units total) by mouth every 7 (seven) days.    Past medical history, social, surgical and family history all reviewed in electronic medical record.  No pertanent information unless stated regarding to the chief complaint.   Review of Systems:  No headache, visual changes, nausea, vomiting, diarrhea, constipation, dizziness, abdominal pain, skin rash, fevers, chills, night sweats, weight loss, swollen lymph nodes, body aches, joint swelling, muscle aches, chest pain, shortness of breath, mood changes.   Objective  Blood pressure 122/80, pulse (!) 58,  height 5\' 6"  (1.676 m), weight 134 lb (60.8 kg), SpO2 98 %. Systems examined below as of    General: No apparent distress alert and oriented x3 mood and affect normal, dressed appropriately.  HEENT: Pupils equal, extraocular movements intact  Respiratory: Patient's speak in full sentences and does not appear short of breath  Cardiovascular: No lower extremity edema, non tender, no erythema  Skin: Warm dry intact with no signs of infection or rash on extremities or on axial skeleton.  Abdomen: Soft nontender  Neuro: Cranial nerves II through XII are intact, neurovascularly intact in all extremities with 2+ DTRs and 2+ pulses.  Lymph: No lymphadenopathy of posterior or anterior cervical chain or axillae bilaterally.  Gait  normal with good balance and coordination.  MSK:  Non tender with full range of motion and good stability and symmetric strength and tone of shoulders, elbows, wrist, hip, knee and ankles bilaterally.  Neck: Inspection mild loss of lordosis. No palpable stepoffs. Negative Spurling's maneuver. Tightness with flexion and extension of 5 degrees in all planes Grip strength and sensation normal in bilateral hands Strength good C4 to T1 distribution No sensory change to C4 to T1 Negative Hoffman sign bilaterally Reflexes normal Moderate tenderness to palpation in the trapezius bilaterally   Osteopathic findings C2 flexed rotated and side bent right C4 flexed rotated and side bent left C6 flexed rotated and side bent left T3 extended rotated and side bent right inhaled third rib T9 extended rotated and side bent left L1 flexed rotated and side bent right   Impression and Recommendations:     This case required medical decision making of moderate complexity. The above documentation has been reviewed and is accurate and complete Gina [name].      Note: This dictation was prepared with Dragon dictation along with smaller phrase technology. Any transcriptional errors that result from this process are unintentional.

## 2018-11-27 NOTE — Patient Instructions (Signed)
Good to see you as always.  Ice 20 minutes 2 times daily. Usually after activity and before bed. See me when you need me!

## 2019-03-06 DIAGNOSIS — F411 Generalized anxiety disorder: Secondary | ICD-10-CM | POA: Diagnosis not present

## 2019-05-20 NOTE — Progress Notes (Signed)
[name] [place], [place] [address] Phone: [phone] Subjective:   I Gina [name] am serving as a Education administrator for Dr. Hulan Saas.   CC: Neck, back, left shoulder pain  [id number]  Gina [name] is a 53 y.o. female coming in with complaint of left shoulder and back pain. Would like a shoulder injection. OMT. Radiating pain down her right side. 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  Patient feels that it is more pain when sleeping at night.  Patient denies any radiation down the arm.  Denies any new injuries.    Past Medical History:  Diagnosis Date  . Acute meniscal tear of knee   . Allergy   . Migraine    No past surgical history on file. Social History   Socioeconomic History  . Marital status: Significant Other    Spouse name: Not on file  . Number of children: Not on file  . Years of education: Not on file  . Highest education level: Not on file  Occupational History  . Not on file  Social Needs  . Financial resource strain: Not on file  . Food insecurity    Worry: Not on file    Inability: Not on file  . Transportation needs    Medical: Not on file    Non-medical: Not on file  Tobacco Use  . Smoking status: Never Smoker  . Smokeless tobacco: Never Used  Substance and Sexual Activity  . Alcohol use: Yes  . Drug use: No  . Sexual activity: Yes    Comment: with females  Lifestyle  . Physical activity    Days per week: Not on file    Minutes per session: Not on file  . Stress: Not on file  Relationships  . Social Herbalist on phone: Not on file    Gets together: Not on file    Attends religious service: Not on file    Active member of club or organization: Not on file    Attends meetings of clubs or organizations: Not on file    Relationship status: Not on file  Other Topics Concern  . Not on file  Social History Narrative  . Not on file    No Known Allergies No family history on file.     Current Outpatient Medications (Analgesics):  .  ibuprofen (ADVIL,MOTRIN) 600 MG tablet, Take 1 tablet (600 mg total) by mouth every 8 (eight) hours as needed.   Current Outpatient Medications (Other):  .  diazepam (VALIUM) 5 MG tablet, Take 1 tablet (5 mg total) by mouth every 12 (twelve) hours as needed. One tab by mouth, 2 hours before procedure. .  LamoTRIgine (LAMICTAL XR) 50 MG TB24 24 hour tablet, Take 1 tablet (50 mg total) by mouth daily. .  Vitamin D, Ergocalciferol, (DRISDOL) 50000 units CAPS capsule, Take 1 capsule (50,000 Units total) by mouth every 7 (seven) days. .  carisoprodol (SOMA) 350 MG tablet, Take 1 tablet (350 mg total) by mouth 3 (three) times daily as needed for muscle spasms.    Past medical history, social, surgical and family history all reviewed in electronic medical record.  No pertanent information unless stated regarding to the chief complaint.   Review of Systems:  No headache, visual changes, nausea, vomiting, diarrhea, constipation, dizziness, abdominal pain, skin rash, fevers, chills, night sweats, weight loss, swollen lymph nodes, body aches, joint swelling,  chest  pain, shortness of breath, mood changes.  Positive muscle aches  Objective  Blood pressure (!) 146/84, pulse 76, height 5\' 6"  (1.676 m), weight 131 lb (59.4 kg), SpO2 92 %.    General: No apparent distress alert and oriented x3 mood and affect normal, dressed appropriately.  HEENT: Pupils equal, extraocular movements intact  Respiratory: Patient's speak in full sentences and does not appear short of breath  Cardiovascular: No lower extremity edema, non tender, no erythema  Skin: Warm dry intact with no signs of infection or rash on extremities or on axial skeleton.  Abdomen: Soft nontender  Neuro: Cranial nerves II through XII are intact, neurovascularly intact in all extremities with 2+ DTRs and 2+ pulses.  Lymph: No  lymphadenopathy of posterior or anterior cervical chain or axillae bilaterally.  Gait normal with good balance and coordination.  MSK:  Non tender with full range of motion and good stability and symmetric strength and tone of  elbows, wrist, hip, knee and ankles bilaterally.  Shoulder: Left Inspection reveals no abnormalities, atrophy or asymmetry. Palpation tender over the acromioclavicular joint ROM is full in all planes. Rotator cuff strength normal throughout. No signs of impingement with negative Neer and Hawkin's tests, empty can sign. Speeds and Yergason's tests normal. No labral pathology noted with negative Obrien's, negative clunk and good stability.  Positive crossover Normal scapular function observed. No painful arc and no drop arm sign. No apprehension sign    Neck: Inspection mild loss of lordosis. No palpable stepoffs. Negative Spurling's maneuver. Full neck range of motion Grip strength and sensation normal in bilateral hands Strength good C4 to T1 distribution No sensory change to C4 to T1 Negative Hoffman sign bilaterally Reflexes normal  Osteopathic findings C2 flexed rotated and side bent right T9 extended rotated and side bent left L2 flexed rotated and side bent right Sacrum right on right  Procedure: Real-time Ultrasound Guided Injection of left acromioclavicular joint Device: GE Logiq Q7 Ultrasound guided injection is preferred based studies that show increased duration, increased effect, greater accuracy, decreased procedural pain, increased response rate, and decreased cost with ultrasound guided versus blind injection.  Verbal informed consent obtained.  Time-out conducted.  Noted no overlying erythema, induration, or other signs of local infection.  Skin prepped in a sterile fashion.  Local anesthesia: Topical Ethyl chloride.  With sterile technique and under real time ultrasound guidance: With a 25-gauge half inch needle injected with 0.5 cc of  0.5% Marcaine and 0.5 cc of Kenalog 40 mg/mL Completed without difficulty  Pain immediately resolved suggesting accurate placement of the medication.  Advised to call if fevers/chills, erythema, induration, drainage, or persistent bleeding.  Images permanently stored and available for review in the ultrasound unit.  Impression: Technically successful ultrasound guided injection.   Impression and Recommendations:     This case required medical decision making of moderate complexity. The above documentation has been reviewed and is accurate and complete [name], DO       Note: This dictation was prepared with Dragon dictation along with smaller phrase technology. Any transcriptional errors that result from this process are unintentional.

## 2019-05-21 ENCOUNTER — Ambulatory Visit: Payer: Self-pay

## 2019-05-21 ENCOUNTER — Ambulatory Visit (INDEPENDENT_AMBULATORY_CARE_PROVIDER_SITE_OTHER): Payer: BC Managed Care – PPO | Admitting: Family Medicine

## 2019-05-21 ENCOUNTER — Other Ambulatory Visit: Payer: Self-pay

## 2019-05-21 ENCOUNTER — Encounter: Payer: Self-pay | Admitting: Family Medicine

## 2019-05-21 VITALS — BP 146/84 | HR 76 | Ht 66.0 in | Wt 131.0 lb

## 2019-05-21 DIAGNOSIS — M999 Biomechanical lesion, unspecified: Secondary | ICD-10-CM | POA: Diagnosis not present

## 2019-05-21 DIAGNOSIS — M542 Cervicalgia: Secondary | ICD-10-CM | POA: Diagnosis not present

## 2019-05-21 DIAGNOSIS — S43102A Unspecified dislocation of left acromioclavicular joint, initial encounter: Secondary | ICD-10-CM | POA: Diagnosis not present

## 2019-05-21 DIAGNOSIS — M533 Sacrococcygeal disorders, not elsewhere classified: Secondary | ICD-10-CM | POA: Diagnosis not present

## 2019-05-21 DIAGNOSIS — G8929 Other chronic pain: Secondary | ICD-10-CM | POA: Diagnosis not present

## 2019-05-21 DIAGNOSIS — M25512 Pain in left shoulder: Secondary | ICD-10-CM

## 2019-05-21 MED ORDER — CARISOPRODOL 350 MG PO TABS: 350.0000 mg | ORAL_TABLET | Freq: Three times a day (TID) | ORAL | 0 refills | Status: DC | PRN

## 2019-05-21 NOTE — Patient Instructions (Addendum)
Good to see you Shoulder injection today- AC joint Physical therapy will be calling you See me in 5-6 weeks

## 2019-05-21 NOTE — Assessment & Plan Note (Signed)
Stable.  We discussed ergonomics at work.  Discussed lifting mechanics.  Patient has been very active recently.  Follow-up again in 4 to 8 weeks

## 2019-05-21 NOTE — Assessment & Plan Note (Signed)
Decision today to treat with OMT was based on Physical Exam  After verbal consent patient was treated with HVLA, ME, FPR techniques in cervical, thoracic, lumbar and sacral areas  Patient tolerated the procedure well with improvement in symptoms  Patient given exercises, stretches and lifestyle modifications  See medications in patient instructions if given  Patient will follow up in 4-8 weeks 

## 2019-05-21 NOTE — Assessment & Plan Note (Signed)
Injection given.  Tolerated the procedure well.  Discussed icing regimen and home exercise.  Discussed which activities to do which wants to avoid.  Patient should do well with conservative therapy.  Follow-up again in 4 to 8 weeks

## 2019-05-29 ENCOUNTER — Encounter: Payer: Self-pay | Admitting: Physical Therapy

## 2019-05-29 ENCOUNTER — Ambulatory Visit: Payer: BC Managed Care – PPO | Attending: Family Medicine | Admitting: Physical Therapy

## 2019-05-29 ENCOUNTER — Other Ambulatory Visit: Payer: Self-pay

## 2019-05-29 DIAGNOSIS — M25511 Pain in right shoulder: Secondary | ICD-10-CM | POA: Insufficient documentation

## 2019-05-29 DIAGNOSIS — G8929 Other chronic pain: Secondary | ICD-10-CM

## 2019-05-29 DIAGNOSIS — M5441 Lumbago with sciatica, right side: Secondary | ICD-10-CM | POA: Diagnosis present

## 2019-05-29 DIAGNOSIS — M6281 Muscle weakness (generalized): Secondary | ICD-10-CM | POA: Insufficient documentation

## 2019-05-29 NOTE — Patient Instructions (Signed)
Access Code: [email or web address]  URL: [email or web address]  Date: 05/29/2019  Prepared by: Ruben Im   Exercises  Bird Dog - 15 reps - 1 sets - 1x daily - 7x weekly  Seated Thoracic Lumbar Extension - 10 reps - 1 sets - 1x daily - 7x weekly  Thoracic Extension Mobilization on Foam Roll - 10 reps - 1 sets - 1x daily - 7x weekly

## 2019-05-29 NOTE — Therapy (Signed)
[facility] [address] [place], [place], [address] Phone: [phone]   Fax:  [phone]  Physical Therapy Evaluation  Patient Details  Name: Gina [name] MRN: [id number] Date of Birth: 10/02/1966 Referring Provider (PT): Dr. Charlann Boxer    Encounter Date: 05/29/2019  PT End of Session - 05/29/19 1957    Visit Number  1    Date for PT Re-Evaluation  07/24/19    Authorization Type  29 of 30 remaining visits BCBS    PT Start Time  4097    PT Stop Time  1520    PT Time Calculation (min)  46 min    Activity Tolerance  Patient tolerated treatment well       Past Medical History:  Diagnosis Date  . Acute meniscal tear of knee   . Allergy   . Migraine     History reviewed. No pertinent surgical history.  There were no vitals filed for this visit.   Subjective Assessment - 05/29/19 1437    Subjective  Participated in roller derby for years and as a result has developed  right  inferior scapular pain and right lower back, buttock and posterior leg pain.  Leg pain occurs 4x/week    Pertinent History  had left shoulder injection for small tear seen on U/S    Limitations  Lifting;Walking    How long can you sit comfortably?  I move constantly 1 1/2 hours    How long can you walk comfortably?  30 minutes 2-3/10 pain    Diagnostic tests  U/S but no MRI    Patient Stated Goals  find a way to decrease pain and increase flexibility    Currently in Pain?  Yes    Pain Score  5     Pain Location  Back    Pain Orientation  Right    Pain Type  Chronic pain    Aggravating Factors   pulling a rug;  incline walking, up stairs;  right sidelying    Pain Relieving Factors  dry needling helped short term 6 months ago    Multiple Pain Sites  Yes    Pain Score  6    Pain Location  Scapula    Pain Orientation  Right    Pain Type  Chronic pain    Pain Relieving Factors  foam roll;  blitz ball, stretching         [facility]  PT Assessment - 05/29/19 0001      Assessment   Medical Diagnosis  right scapular pain; right lower back     Referring Provider (PT)  Dr. Charlann Boxer     Onset Date/Surgical Date  --   > 6 months   Hand Dominance  Left    Next MD Visit  Aug 19th     Prior Therapy   PT 6 months ago had DN       Precautions   Precautions  None      Restrictions   Weight Bearing Restrictions  No      Balance Screen   Has the patient fallen in the past 6 months  No    Has the patient had a decrease in activity level because of a fear of falling?   No    Is the patient reluctant to leave their home because of a fear of falling?   No      Home Film/video editor residence  Type of Home  House    Home Access  Level entry      Prior Function   Level of Independence  Independent    Vocation  Full time employment    Programme researcher, broadcasting/film/video for [employer];  [employer] lots of walking     Leisure  hike, kayak, outdoor walking       Observation/Other Assessments   Focus on Therapeutic Outcomes (FOTO)   23% limitation       Posture/Postural Control   Posture/Postural Control  Postural limitations    Postural Limitations  Increased thoracic kyphosis    Posture Comments  pelvic drop in right with SLS       AROM   Overall AROM Comments  decreased thoracic extension ROM;  no directional preference with lumbar repeated movement testing    Right Shoulder Flexion  153 Degrees    Right Shoulder ABduction  145 Degrees    Right Shoulder Internal Rotation  --   excessive and asymmetric  scapular winging noted   Right Shoulder External Rotation  85 Degrees    Left Shoulder Flexion  145 Degrees    Left Shoulder ABduction  155 Degrees    Left Shoulder Internal Rotation  --   T7   Left Shoulder External Rotation  85 Degrees    Lumbar Flexion  80    Lumbar Extension  12    Lumbar - Right Side Bend  45    Lumbar - Left Side Bend  35      Strength   Overall  Strength Comments  decreased stabilization with quadruped UE/LE;  right scapular winging with planks on elbow    Right Shoulder Flexion  5/5    Right Shoulder ABduction  4+/5    Right Shoulder Internal Rotation  4+/5    Right Shoulder External Rotation  4+/5    Right Shoulder Horizontal ABduction  4/5    Left Shoulder Flexion  4+/5    Left Shoulder ABduction  4/5    Left Shoulder Internal Rotation  4+/5    Left Shoulder External Rotation  4+/5    Left Shoulder Horizontal ABduction  4/5    Right Hip Flexion  5/5    Right Hip Extension  4+/5    Right Hip ABduction  4+/5    Left Hip Flexion  5/5    Left Hip Extension  5/5    Left Hip ABduction  5/5    Lumbar Flexion  4+/5    Lumbar Extension  4/5      Flexibility   Hamstrings  85 degrees bil passively     Quadriceps  decreased right hip flexor length       Palpation   Spinal mobility  WFLS    SI assessment   no pain with sacral thrust or shear     Palpation comment  tender points right inferior scapular region, decreased soft tissue length right QL, piriformis       Slump test   Findings  Negative      Prone Knee Bend Test   Findings  Negative      Straight Leg Raise   Findings  Negative      SI Compression   Findings  Negative      Hip Scouring   Findings  Positive    Side  Right    Comments  1:00                Objective measurements completed on  examination: See above findings.              PT Education - 05/29/19 1521    Education Details  Access Code: [email or web address] bird dogs, seated and supine thoracic extension    Person(s) Educated  Patient    Methods  Explanation;Demonstration;Handout    Comprehension  Returned demonstration;Verbalized understanding       PT Short Term Goals - 05/29/19 2012      PT SHORT TERM GOAL #1   Title  The patient will demonstrate basic self care strategies and compliance with initial HEP    Time  4    Period  Weeks    Status  New    Target Date  06/26/19       PT SHORT TERM GOAL #2   Title  The patient will report a 30% reduction in right scapular region pain with lifting, pushing and pulling    Time  4    Period  Weeks    Status  New      PT SHORT TERM GOAL #3   Title  The patient will report a 30% reduction in right lumbar pain and with decreased right LE symptoms to 3x/week    Time  4    Period  Weeks    Status  New      PT SHORT TERM GOAL #4   Title  The patient will have improved lumbar extension to 25 degrees and left sidebending to 40 degrees needed for hiking and kayaking    Time  8    Period  Weeks    Status  New      PT SHORT TERM GOAL #5   Title  Increased right shoulder elevation and abduction to 155 degrees needed for overhead reaching    Time  4    Period  Weeks    Status  New        PT Long Term Goals - 05/29/19 2014      PT LONG TERM GOAL #1   Title  The patient will be independent with safe self progression of HEP    Time  8    Period  Weeks    Status  New    Target Date  07/24/19      PT LONG TERM GOAL #2   Title  The patient will report a 60% reduction in right scapular pain for lifting and carrying medium to heavier objects    Time  8    Period  Weeks    Status  New      PT LONG TERM GOAL #3   Title  The patient will report 3/10 pain or less with walking on an incline or up stairs    Time  8    Period  Weeks    Status  New      PT LONG TERM GOAL #4   Title  Lumbo/pelvic and right hip strength grossly 4+/5 to 5-/5 needed for recreational activities hiking and kayaking    Time  8    Period  Weeks    Status  New      PT LONG TERM GOAL #5   Title  FOTO functional outcome score improved to 20% limitation or less    Time  8    Period  Weeks    Status  New             Plan - 05/29/19 1958    Clinical Impression Statement  The  patient reports a long history of right inferior scapular pain and right lumbar and buttock pain on a fairly constant basis.  4-5x/week she has posterior thigh,  lower leg pain to the ankle.  Symptoms are aggravated with pulling a rug recently but also with walking up an incline and ascending stairs and sometimes right sidelying. Increased thoracic kyphosis.  Excessive right scapular winging with internal rotation and planks.  Decreased lumbar extension and left sidebending ROM.  Pelvic drop noted with SLS on right.  Tender points in rhomboids, QL and piriformis muscles.  Negative neural tension tests.  Decreased strength particularly middle and lower traps, serratus anterior, gluteus medius and lumbar multifidi.  She would benefit from PT to address these deficits.    Personal Factors and Comorbidities  Past/Current Experience;Time since onset of injury/illness/exacerbation    Examination-Activity Limitations  Lift;Locomotion Level;Stairs    Examination-Participation Restrictions  Community Activity    Stability/Clinical Decision Making  Stable/Uncomplicated    Clinical Decision Making  Low    Rehab Potential  Good    PT Frequency  2x / week    PT Duration  8 weeks    PT Treatment/Interventions  ADLs/Self Care Home Management;Cryotherapy;Electrical Stimulation;Traction;Moist Heat;Therapeutic activities;Therapeutic exercise;Neuromuscular re-education;Manual techniques;Dry needling;Taping;Spinal Manipulations;Iontophoresis 4mg /ml Dexamethasone    PT Next Visit Plan  thoracic extension mobilizations;  instruct in lower and middle trap strengthening, serratus punches, prone multifidi series including head lift/arm lift;  DN rhomboids, QL, multifidi and piriformis as needed;  progress to Pallof series and 1/2 kneel core    PT Home Exercise Plan  Access Code: [email or web address] bird dogs, seated and supine thoracic extension    Consulted and Agree with Plan of Care  Patient       Patient will benefit from skilled therapeutic intervention in order to improve the following deficits and impairments:  Decreased range of motion, Pain, Decreased strength, Impaired perceived  functional ability, Postural dysfunction  Visit Diagnosis: 1. Chronic right shoulder pain   2. Chronic right-sided low back pain with right-sided sciatica   3. Muscle weakness (generalized)        Problem List Patient Active Problem List   Diagnosis Date Noted  . Greater trochanteric bursitis of right hip 09/12/2018  . Right lateral epicondylitis 02/16/2018  . Iron deficiency anemia 01/09/2018  . Nonallopathic lesion of thoracic region 02/26/2016  . Nonallopathic lesion of lumbar region 02/26/2016  . Right hamstring injury 02/11/2014  . Neck pain on right side 07/13/2013  . Nonallopathic lesion of cervical region 07/13/2013  . Patellar contusion 07/13/2013  . Acromioclavicular separation, type 1 12/19/2012  . Hip pain, right 02/02/2012  . Abnormality of gait 02/02/2012   Ruben Im, PT 05/29/19 8:24 PM Phone: [phone] Fax: [phone]  Alvera Singh 05/29/2019, 8:24 PM  [facility] [address], [place], [place], [address] Phone: [phone]   Fax:  [phone]  Name: Gina [name] MRN: [id number] Date of Birth: [birth year]

## 2019-06-12 ENCOUNTER — Other Ambulatory Visit: Payer: Self-pay

## 2019-06-12 ENCOUNTER — Ambulatory Visit: Payer: BC Managed Care – PPO | Attending: Family Medicine | Admitting: Physical Therapy

## 2019-06-12 DIAGNOSIS — G8929 Other chronic pain: Secondary | ICD-10-CM

## 2019-06-12 DIAGNOSIS — M5441 Lumbago with sciatica, right side: Secondary | ICD-10-CM | POA: Insufficient documentation

## 2019-06-12 DIAGNOSIS — M6281 Muscle weakness (generalized): Secondary | ICD-10-CM | POA: Diagnosis present

## 2019-06-12 DIAGNOSIS — M25511 Pain in right shoulder: Secondary | ICD-10-CM | POA: Insufficient documentation

## 2019-06-12 NOTE — Therapy (Signed)
[facility] [address], [place], [place], [address] Phone: [phone]   Fax:  [phone]  Physical Therapy Treatment  Patient Details  Name: Gina [name] MRN: [id number] Date of Birth: 02-26-1966 Referring Provider (PT): Dr. Charlann Boxer    Encounter Date: 06/12/2019  PT End of Session - 06/12/19 1714    Visit Number  2    Number of Visits  29    Date for PT Re-Evaluation  07/24/19    Authorization Type  29 of 30 remaining visits BCBS    PT Start Time  1515    PT Stop Time  1600    PT Time Calculation (min)  45 min    Activity Tolerance  Patient tolerated treatment well       Past Medical History:  Diagnosis Date  . Acute meniscal tear of knee   . Allergy   . Migraine     No past surgical history on file.  There were no vitals filed for this visit.  Subjective Assessment - 06/12/19 1515    Subjective  Denies changes from last visit.  Scapula pain and hurts to sleep on right side.  Hurts around ischial tuberosity.    Pertinent History  had left shoulder injection for small tear seen on U/S    Patient Stated Goals  find a way to decrease pain and increase flexibility    Currently in Pain?  Yes    Pain Score  5     Pain Location  Scapula    Pain Orientation  Right    Pain Type  Chronic pain    Multiple Pain Sites  Yes    Pain Score  7    Pain Location  Hip    Pain Orientation  Right                       [facility] Adult PT Treatment/Exercise - 06/12/19 0001      Lumbar Exercises: Prone   Other Prone Lumbar Exercises  over 1 pillow pelvic press 5x, press with HS curls 5x, press with hip extension, press with bent knee extension 5x each     Other Prone Lumbar Exercises  head lift with Is, Ts, Ws 5x each       Moist Heat Therapy   Number Minutes Moist Heat  5 Minutes    Moist Heat Location  Shoulder;Hip      Manual Therapy   Joint Mobilization  neutral gapping right lumbar spine  and hip;  right hip long axis distraction 3x 30 sec     Soft tissue mobilization  right periscapular muscles, right paraspinals and right gluteals and piriformis        Trigger Point Dry Needling - 06/12/19 0001    Consent Given?  Yes    Education Handout Provided  Previously provided    Muscles Treated Upper Quadrant  Rhomboids;Subscapularis    Muscles Treated Back/Hip  Gluteus minimus;Gluteus medius;Gluteus maximus;Piriformis;Lumbar multifidi    Dry Needling Comments  right side only     Rhomboids Response  Twitch response elicited;Palpable increased muscle length    Subscapularis Response  Palpable increased muscle length    Gluteus Minimus Response  Twitch response elicited;Palpable increased muscle length    Gluteus Medius Response  Palpable increased muscle length    Gluteus Maximus Response  Palpable increased muscle length    Piriformis Response  Palpable increased muscle length    Lumbar multifidi Response  Palpable  increased muscle length           PT Education - 06/12/19 1553    Education Details  prone multifidi series;  had DN in the past    Person(s) Educated  Patient    Methods  Explanation;Demonstration;Handout    Comprehension  Returned demonstration;Verbalized understanding       PT Short Term Goals - 05/29/19 2012      PT SHORT TERM GOAL #1   Title  The patient will demonstrate basic self care strategies and compliance with initial HEP    Time  4    Period  Weeks    Status  New    Target Date  06/26/19      PT SHORT TERM GOAL #2   Title  The patient will report a 30% reduction in right scapular region pain with lifting, pushing and pulling    Time  4    Period  Weeks    Status  New      PT SHORT TERM GOAL #3   Title  The patient will report a 30% reduction in right lumbar pain and with decreased right LE symptoms to 3x/week    Time  4    Period  Weeks    Status  New      PT SHORT TERM GOAL #4   Title  The patient will have improved lumbar  extension to 25 degrees and left sidebending to 40 degrees needed for hiking and kayaking    Time  8    Period  Weeks    Status  New      PT SHORT TERM GOAL #5   Title  Increased right shoulder elevation and abduction to 155 degrees needed for overhead reaching    Time  4    Period  Weeks    Status  New        PT Long Term Goals - 05/29/19 2014      PT LONG TERM GOAL #1   Title  The patient will be independent with safe self progression of HEP    Time  8    Period  Weeks    Status  New    Target Date  07/24/19      PT LONG TERM GOAL #2   Title  The patient will report a 60% reduction in right scapular pain for lifting and carrying medium to heavier objects    Time  8    Period  Weeks    Status  New      PT LONG TERM GOAL #3   Title  The patient will report 3/10 pain or less with walking on an incline or up stairs    Time  8    Period  Weeks    Status  New      PT LONG TERM GOAL #4   Title  Lumbo/pelvic and right hip strength grossly 4+/5 to 5-/5 needed for recreational activities hiking and kayaking    Time  8    Period  Weeks    Status  New      PT LONG TERM GOAL #5   Title  FOTO functional outcome score improved to 20% limitation or less    Time  8    Period  Weeks    Status  New            Plan - 06/12/19 1550    Clinical Impression Statement  The patient has multiple tender points in  right rhomboids and pirifomis muscles.  Twitch responses produced with DN which is a positive prognostic indicator.   Much improved soft tissue mobility following DN and manual therapy.  With verbal and tactile cues she is able to activate lumbar multifidi without gluteal compensation.  Improved thoracic extension and activation of middle and lower traps.  Therapist closely monitoring response with all treatment interventions.    Personal Factors and Comorbidities  Past/Current Experience;Time since onset of injury/illness/exacerbation    Examination-Activity Limitations   Lift;Locomotion Level;Stairs    Examination-Participation Restrictions  Community Activity    Rehab Potential  Good    PT Frequency  2x / week    PT Duration  8 weeks    PT Treatment/Interventions  ADLs/Self Care Home Management;Cryotherapy;Electrical Stimulation;Traction;Moist Heat;Therapeutic activities;Therapeutic exercise;Neuromuscular re-education;Manual techniques;Dry needling;Taping;Spinal Manipulations;Iontophoresis 4mg /ml Dexamethasone    PT Next Visit Plan  thoracic extension mobilizations;  instruct in lower and middle trap strengthening, serratus punches,review of  prone multifidi series including head lift/arm lift; assess response to DN;  progress to Pallof series and 1/2 kneel core    PT Home Exercise Plan  Access Code: [email or web address] bird dogs, seated and supine thoracic extension       Patient will benefit from skilled therapeutic intervention in order to improve the following deficits and impairments:  Decreased range of motion, Pain, Decreased strength, Impaired perceived functional ability, Postural dysfunction  Visit Diagnosis: 1. Chronic right shoulder pain   2. Chronic right-sided low back pain with right-sided sciatica   3. Muscle weakness (generalized)        Problem List Patient Active Problem List   Diagnosis Date Noted  . Greater trochanteric bursitis of right hip 09/12/2018  . Right lateral epicondylitis 02/16/2018  . Iron deficiency anemia 01/09/2018  . Nonallopathic lesion of thoracic region 02/26/2016  . Nonallopathic lesion of lumbar region 02/26/2016  . Right hamstring injury 02/11/2014  . Neck pain on right side 07/13/2013  . Nonallopathic lesion of cervical region 07/13/2013  . Patellar contusion 07/13/2013  . Acromioclavicular separation, type 1 12/19/2012  . Hip pain, right 02/02/2012  . Abnormality of gait 02/02/2012   Ruben Im, PT 06/12/19 5:20 PM Phone: [phone] Fax: [phone]  Alvera Singh 06/12/2019, 5:20 PM  [facility] [address], [place], [place], [address] Phone: [phone]   Fax:  [phone]  Name: Clytee Heinrich MRN: [id number] Date of Birth: Sep 28, 1966

## 2019-06-12 NOTE — Patient Instructions (Signed)
Pt given handout for prone multifidus activation.   Prone pelvic press  10x right and left each Prone pelvic press with knee flex  Right and left 10 x each and then bilaterally x 10 Prone pelvic press with hip extension right and left 10 x each Prone pelvic press with knee flex and hip ext Right and left 10 times each Upper body sequence  Start with pelvic press  Do 10 reps with arms in  T, W M Y shape keep neck in neutral position Lift head and arms up . Then lift upper body.      Ruben Im PT [facility] [address] [place], [place] [address] Phone # [phone] Fax [phone]

## 2019-06-14 ENCOUNTER — Ambulatory Visit: Payer: BC Managed Care – PPO | Admitting: Physical Therapy

## 2019-06-14 ENCOUNTER — Other Ambulatory Visit: Payer: Self-pay

## 2019-06-14 ENCOUNTER — Encounter: Payer: Self-pay | Admitting: Physical Therapy

## 2019-06-14 DIAGNOSIS — G8929 Other chronic pain: Secondary | ICD-10-CM

## 2019-06-14 DIAGNOSIS — M25511 Pain in right shoulder: Secondary | ICD-10-CM | POA: Diagnosis not present

## 2019-06-14 DIAGNOSIS — M6281 Muscle weakness (generalized): Secondary | ICD-10-CM

## 2019-06-14 DIAGNOSIS — M5441 Lumbago with sciatica, right side: Secondary | ICD-10-CM | POA: Diagnosis not present

## 2019-06-14 NOTE — Therapy (Signed)
[facility] [address] [place], [place], [address] Phone: [phone]   Fax:  [phone]  Physical Therapy Treatment  Patient Details  Name: Gina [name] MRN: [id number] Date of Birth: [birth year] Referring Provider (PT): Dr. Charlann Boxer    Encounter Date: 06/14/2019  PT End of Session - 06/14/19 1912    Visit Number  3    Number of Visits  29    Date for PT Re-Evaluation  07/24/19    Authorization Type  29 of 30 remaining visits BCBS    PT Start Time  1050    PT Stop Time  1129    PT Time Calculation (min)  39 min    Activity Tolerance  Patient tolerated treatment well       Past Medical History:  Diagnosis Date  . Acute meniscal tear of knee   . Allergy   . Migraine     History reviewed. No pertinent surgical history.  There were no vitals filed for this visit.  Subjective Assessment - 06/14/19 1051    Subjective  right scapular tightness and central ball;  did some of the exercises    Pertinent History  had left shoulder injection for small tear seen on U/S    Diagnostic tests  U/S but no MRI    Patient Stated Goals  find a way to decrease pain and increase flexibility    Currently in Pain?  Yes    Pain Score  2     Pain Location  Scapula    Pain Orientation  Right    Pain Type  Chronic pain    Pain Score  2    Pain Location  Hip                       [facility] Adult PT Treatment/Exercise - 06/14/19 0001      Lumbar Exercises: Stretches   Other Lumbar Stretch Exercise  foam roll thread the needle 10x right/left     Other Lumbar Stretch Exercise  childs pose with foam roll out 10x       Lumbar Exercises: Aerobic   UBE (Upper Arm Bike)  seated 2/2 minutes       Lumbar Exercises: Standing   Other Standing Lumbar Exercises  red band Pallof series SLS 5x 5 reps right/left     Other Standing Lumbar Exercises  red band around wrists C scoops on wall 15x       Lumbar Exercises:  Supine   Other Supine Lumbar Exercises  lying on foam roll with bil UEs overhead    Other Supine Lumbar Exercises  foam roll thoracic extension       Lumbar Exercises: Prone   Other Prone Lumbar Exercises  over 1 pillow pelvic press 5x, press with HS curls 5x, press with hip extension, press with bent knee extension 5x each     Other Prone Lumbar Exercises  head lift with Is, Ts, Ws 5x each       Lumbar Exercises: Quadruped   Plank  open books 10x right/left     Other Quadruped Lumbar Exercises  1/2 kneel with hip flexor stretch with UE movements    Other Quadruped Lumbar Exercises  1/2 kneel with green band horizontal abduction  and steering wheels 15 xea                PT Short Term Goals - 05/29/19 2012      PT SHORT  TERM GOAL #1   Title  The patient will demonstrate basic self care strategies and compliance with initial HEP    Time  4    Period  Weeks    Status  New    Target Date  06/26/19      PT SHORT TERM GOAL #2   Title  The patient will report a 30% reduction in right scapular region pain with lifting, pushing and pulling    Time  4    Period  Weeks    Status  New      PT SHORT TERM GOAL #3   Title  The patient will report a 30% reduction in right lumbar pain and with decreased right LE symptoms to 3x/week    Time  4    Period  Weeks    Status  New      PT SHORT TERM GOAL #4   Title  The patient will have improved lumbar extension to 25 degrees and left sidebending to 40 degrees needed for hiking and kayaking    Time  8    Period  Weeks    Status  New      PT SHORT TERM GOAL #5   Title  Increased right shoulder elevation and abduction to 155 degrees needed for overhead reaching    Time  4    Period  Weeks    Status  New        PT Long Term Goals - 05/29/19 2014      PT LONG TERM GOAL #1   Title  The patient will be independent with safe self progression of HEP    Time  8    Period  Weeks    Status  New    Target Date  07/24/19      PT LONG  TERM GOAL #2   Title  The patient will report a 60% reduction in right scapular pain for lifting and carrying medium to heavier objects    Time  8    Period  Weeks    Status  New      PT LONG TERM GOAL #3   Title  The patient will report 3/10 pain or less with walking on an incline or up stairs    Time  8    Period  Weeks    Status  New      PT LONG TERM GOAL #4   Title  Lumbo/pelvic and right hip strength grossly 4+/5 to 5-/5 needed for recreational activities hiking and kayaking    Time  8    Period  Weeks    Status  New      PT LONG TERM GOAL #5   Title  FOTO functional outcome score improved to 20% limitation or less    Time  8    Period  Weeks    Status  New            Plan - 06/14/19 1913    Clinical Impression Statement  The patient is able to perform intermediate level scapular and core strengthening ex's with verbal cues to activate middle and lower traps and to engage gluteals with single leg standing.  She denies an exacerbation of symptoms and with pain level remaining low.  Improved thoracic extension noted.    Rehab Potential  Good    PT Frequency  2x / week    PT Duration  8 weeks    PT Treatment/Interventions  ADLs/Self Care Home  Management;Cryotherapy;Electrical Stimulation;Traction;Moist Heat;Therapeutic activities;Therapeutic exercise;Neuromuscular re-education;Manual techniques;Dry needling;Taping;Spinal Manipulations;Iontophoresis 4mg /ml Dexamethasone    PT Next Visit Plan  DN as needed to gluteals and periscapular muscles;  thoracic extension mobilizations;  lower and middle trap strengthening over ball , serratus punches,assess response to DN;  standing band around thighs;  SLS       Patient will benefit from skilled therapeutic intervention in order to improve the following deficits and impairments:  Decreased range of motion, Pain, Decreased strength, Impaired perceived functional ability, Postural dysfunction  Visit Diagnosis: 1. Chronic right  shoulder pain   2. Chronic right-sided low back pain with right-sided sciatica   3. Muscle weakness (generalized)        Problem List Patient Active Problem List   Diagnosis Date Noted  . Greater trochanteric bursitis of right hip 09/12/2018  . Right lateral epicondylitis 02/16/2018  . Iron deficiency anemia 01/09/2018  . Nonallopathic lesion of thoracic region 02/26/2016  . Nonallopathic lesion of lumbar region 02/26/2016  . Right hamstring injury 02/11/2014  . Neck pain on right side 07/13/2013  . Nonallopathic lesion of cervical region 07/13/2013  . Patellar contusion 07/13/2013  . Acromioclavicular separation, type 1 12/19/2012  . Hip pain, right 02/02/2012  . Abnormality of gait 02/02/2012   Ruben Im, PT 06/14/19 7:22 PM Phone: [phone] Fax: [phone] Alvera Singh 06/14/2019, Carlisle Cater PM  [facility] [address] [place], [place], [address] Phone: [phone]   Fax:  [phone]  Name: Kayle Passarelli MRN: [id number] Date of Birth: May 19, 1966

## 2019-06-19 ENCOUNTER — Ambulatory Visit: Payer: BC Managed Care – PPO | Admitting: Physical Therapy

## 2019-06-19 ENCOUNTER — Other Ambulatory Visit: Payer: Self-pay

## 2019-06-19 DIAGNOSIS — G8929 Other chronic pain: Secondary | ICD-10-CM

## 2019-06-19 DIAGNOSIS — M25511 Pain in right shoulder: Secondary | ICD-10-CM | POA: Diagnosis not present

## 2019-06-19 DIAGNOSIS — M6281 Muscle weakness (generalized): Secondary | ICD-10-CM

## 2019-06-19 DIAGNOSIS — M5441 Lumbago with sciatica, right side: Secondary | ICD-10-CM | POA: Diagnosis not present

## 2019-06-19 NOTE — Therapy (Signed)
[facility] [address], [place], [place], [address] Phone: [phone]   Fax:  [phone]  Physical Therapy Treatment  Patient Details  Name: Gina [name] MRN: [id number] Date of Birth: February 15, 1966 Referring Provider (PT): Dr. Charlann Boxer    Encounter Date: 06/19/2019  PT End of Session - 06/19/19 2219    Visit Number  4    Number of Visits  29    Date for PT Re-Evaluation  07/24/19    Authorization Type  29 of 30 remaining visits BCBS    PT Start Time  5638    PT Stop Time  1137    PT Time Calculation (min)  50 min    Activity Tolerance  Patient tolerated treatment well       Past Medical History:  Diagnosis Date  . Acute meniscal tear of knee   . Allergy   . Migraine     No past surgical history on file.  There were no vitals filed for this visit.  Subjective Assessment - 06/19/19 1051    Subjective  I had a migraine for 3 days but that's better today.  Did some exercises yesterday.  No problem.  Some gluteal tightness and medial right scapular pain.    Pertinent History  had left shoulder injection for small tear seen on U/S    How long can you walk comfortably?  30 minutes 2-3/10 pain    Diagnostic tests  U/S but no MRI    Patient Stated Goals  find a way to decrease pain and increase flexibility    Currently in Pain?  Yes    Pain Score  2     Pain Location  Scapula    Pain Orientation  Right    Pain Type  Chronic pain                       [facility] Adult PT Treatment/Exercise - 06/19/19 0001      Lumbar Exercises: Stretches   Lower Trunk Rotation  3 reps;20 seconds    Lower Trunk Rotation Limitations  per HEP    Figure 4 Stretch  3 reps;20 seconds    Figure 4 Stretch Limitations  seated       Lumbar Exercises: Standing   Other Standing Lumbar Exercises  green band around thighs with side stepping    Other Standing Lumbar Exercises  green band around thighs with right/left  hip diagonals extension 10x      Moist Heat Therapy   Number Minutes Moist Heat  5 Minutes    Moist Heat Location  Shoulder;Hip      Manual Therapy   Soft tissue mobilization  periscapular muscles and upper trap; lumbar paraspinals, gluteals and piriformis        Trigger Point Dry Needling - 06/19/19 0001    Consent Given?  Yes    Muscles Treated Head and Neck  Upper trapezius    Upper Trapezius Response  Twitch reponse elicited;Palpable increased muscle length    Rhomboids Response  Twitch response elicited;Palpable increased muscle length    Gluteus Maximus Response  Twitch response elicited;Palpable increased muscle length    Piriformis Response  Twitch response elicited;Palpable increased muscle length    Lumbar multifidi Response  Palpable increased muscle length           PT Education - 06/19/19 2218    Education Details  green band side step and standing hip clams  Person(s) Educated  Patient    Methods  Explanation;Demonstration;Handout    Comprehension  Returned demonstration;Verbalized understanding       PT Short Term Goals - 05/29/19 2012      PT SHORT TERM GOAL #1   Title  The patient will demonstrate basic self care strategies and compliance with initial HEP    Time  4    Period  Weeks    Status  New    Target Date  06/26/19      PT SHORT TERM GOAL #2   Title  The patient will report a 30% reduction in right scapular region pain with lifting, pushing and pulling    Time  4    Period  Weeks    Status  New      PT SHORT TERM GOAL #3   Title  The patient will report a 30% reduction in right lumbar pain and with decreased right LE symptoms to 3x/week    Time  4    Period  Weeks    Status  New      PT SHORT TERM GOAL #4   Title  The patient will have improved lumbar extension to 25 degrees and left sidebending to 40 degrees needed for hiking and kayaking    Time  8    Period  Weeks    Status  New      PT SHORT TERM GOAL #5   Title  Increased  right shoulder elevation and abduction to 155 degrees needed for overhead reaching    Time  4    Period  Weeks    Status  New        PT Long Term Goals - 05/29/19 2014      PT LONG TERM GOAL #1   Title  The patient will be independent with safe self progression of HEP    Time  8    Period  Weeks    Status  New    Target Date  07/24/19      PT LONG TERM GOAL #2   Title  The patient will report a 60% reduction in right scapular pain for lifting and carrying medium to heavier objects    Time  8    Period  Weeks    Status  New      PT LONG TERM GOAL #3   Title  The patient will report 3/10 pain or less with walking on an incline or up stairs    Time  8    Period  Weeks    Status  New      PT LONG TERM GOAL #4   Title  Lumbo/pelvic and right hip strength grossly 4+/5 to 5-/5 needed for recreational activities hiking and kayaking    Time  8    Period  Weeks    Status  New      PT LONG TERM GOAL #5   Title  FOTO functional outcome score improved to 20% limitation or less    Time  8    Period  Weeks    Status  New            Plan - 06/19/19 1128    Clinical Impression Statement  The patient has fewer overall tender points but several prominent ones in rhomboids and gluteals.  Multiple twitch responses produced which is a good prognostic indicator.  On track to meet rehab goals.  Therapist closely monitoring response with all treatment interventions.  Personal Factors and Comorbidities  Past/Current Experience;Time since onset of injury/illness/exacerbation    Examination-Activity Limitations  Lift;Locomotion Level;Stairs    Examination-Participation Restrictions  Community Activity    Stability/Clinical Decision Making  Stable/Uncomplicated    Rehab Potential  Good    PT Frequency  2x / week    PT Treatment/Interventions  ADLs/Self Care Home Management;Cryotherapy;Electrical Stimulation;Traction;Moist Heat;Therapeutic activities;Therapeutic exercise;Neuromuscular  re-education;Manual techniques;Dry needling;Taping;Spinal Manipulations;Iontophoresis 4mg /ml Dexamethasone    PT Next Visit Plan  follow up in 1 week after trip to the beach; check STGS;   DN as needed to gluteals and periscapular muscles;  thoracic extension mobilizations;  lower and middle trap strengthening over ball , serratus punches,assess response to DN;  standing band around thighs;  SLS    PT Home Exercise Plan  Access Code: [email or web address] bird dogs, seated and supine thoracic extension       Patient will benefit from skilled therapeutic intervention in order to improve the following deficits and impairments:  Decreased range of motion, Pain, Decreased strength, Impaired perceived functional ability, Postural dysfunction  Visit Diagnosis: 1. Chronic right shoulder pain   2. Chronic right-sided low back pain with right-sided sciatica   3. Muscle weakness (generalized)        Problem List Patient Active Problem List   Diagnosis Date Noted  . Greater trochanteric bursitis of right hip 09/12/2018  . Right lateral epicondylitis 02/16/2018  . Iron deficiency anemia 01/09/2018  . Nonallopathic lesion of thoracic region 02/26/2016  . Nonallopathic lesion of lumbar region 02/26/2016  . Right hamstring injury 02/11/2014  . Neck pain on right side 07/13/2013  . Nonallopathic lesion of cervical region 07/13/2013  . Patellar contusion 07/13/2013  . Acromioclavicular separation, type 1 12/19/2012  . Hip pain, right 02/02/2012  . Abnormality of gait 02/02/2012   Ruben Im, PT 06/19/19 10:23 PM Phone: [phone] Fax: [phone] Alvera Singh 06/19/2019, 10:23 PM  [facility] [address], [place], [place], [address] Phone: [phone]   Fax:  [phone]  Name: Gina [name] MRN: [id number] Date of Birth: Apr 21, 1966

## 2019-06-19 NOTE — Patient Instructions (Signed)
Access Code: [email or web address]  URL: [email or web address]  Date: 06/19/2019  Prepared by: Ruben Im   Exercises  Bird Dog - 15 reps - 1 sets - 1x daily - 7x weekly  Seated Thoracic Lumbar Extension - 10 reps - 1 sets - 1x daily - 7x weekly  Thoracic Extension Mobilization on Foam Roll - 10 reps - 1 sets - 1x daily - 7x weekly  Supine Gluteus Stretch - 3 reps - 1 sets - 20 hold - 1x daily - 7x weekly  Seated Table Piriformis Stretch - 3 reps - 1 sets - 20 hold - 1x daily - 7x weekly  Side Stepping with Resistance at Thighs - 10 reps - 2 sets - 1x daily - 7x weekly  Standing Clam with Resistance Loop - 10 reps - 2 sets - 1x daily - 7x weekly

## 2019-06-21 ENCOUNTER — Encounter: Payer: BC Managed Care – PPO | Admitting: Physical Therapy

## 2019-06-26 NOTE — Progress Notes (Signed)
[name] [place] [place], [place] [address] Phone: [phone] Subjective:   [name], am serving as a scribe for Dr. Hulan Saas.  CC: Neck and shoulder pain follow-up  QIH:[id number]   05/21/2019 Injection given.  Tolerated the procedure well.  Discussed icing regimen and home exercise.  Discussed which activities to do which wants to avoid.  Patient should do well with conservative therapy.  Follow-up again in 4 to 8 weeks  Stable.  We discussed ergonomics at work.  Discussed lifting mechanics.  Patient has been very active recently.  Follow-up again in 4 to 8 weeks  Update 06/27/2019 Gina [name] is a 53 y.o. female coming in with complaint of right shoulder pain and neck pain.   Patient has been doing formal physical therapy and given a left acromioclavicular injection May 21, 2019. Patient states that she is doing PT for her back and neck. Continues to have pain in left shoulder. Feels some relief from injection. Pain with adduction.   Past Medical History:  Diagnosis Date  . Acute meniscal tear of knee   . Allergy   . Migraine    No past surgical history on file. Social History   Socioeconomic History  . Marital status: Significant Other    Spouse name: Not on file  . Number of children: Not on file  . Years of education: Not on file  . Highest education level: Not on file  Occupational History  . Not on file  Social Needs  . Financial resource strain: Not on file  . Food insecurity    Worry: Not on file    Inability: Not on file  . Transportation needs    Medical: Not on file    Non-medical: Not on file  Tobacco Use  . Smoking status: Never Smoker  . Smokeless tobacco: Never Used  Substance and Sexual Activity  . Alcohol use: Yes  . Drug use: No  . Sexual activity: Yes    Comment: with females  Lifestyle  . Physical activity    Days per week: Not on file    Minutes per session: Not on file  . Stress:  Not on file  Relationships  . Social Herbalist on phone: Not on file    Gets together: Not on file    Attends religious service: Not on file    Active member of club or organization: Not on file    Attends meetings of clubs or organizations: Not on file    Relationship status: Not on file  Other Topics Concern  . Not on file  Social History Narrative  . Not on file   No Known Allergies No family history on file.     Current Outpatient Medications (Analgesics):  .  ibuprofen (ADVIL) 600 MG tablet, Take 1 tablet (600 mg total) by mouth every 8 (eight) hours as needed.   Current Outpatient Medications (Other):  .  carisoprodol (SOMA) 350 MG tablet, Take 1 tablet (350 mg total) by mouth 3 (three) times daily as needed for muscle spasms. .  diazepam (VALIUM) 5 MG tablet, Take 1 tablet (5 mg total) by mouth every 12 (twelve) hours as needed. One tab by mouth, 2 hours before procedure. .  LamoTRIgine (LAMICTAL XR) 50 MG TB24 24 hour tablet, Take 1 tablet (50 mg total) by mouth daily. .  Vitamin D, Ergocalciferol, (DRISDOL) 50000 units CAPS capsule, Take 1 capsule (50,000 Units total) by mouth every  7 (seven) days.    Past medical history, social, surgical and family history all reviewed in electronic medical record.  No pertanent information unless stated regarding to the chief complaint.   Review of Systems:  No headache, visual changes, nausea, vomiting, diarrhea, constipation, dizziness, abdominal pain, skin rash, fevers, chills, night sweats, weight loss, swollen lymph nodes, body aches, joint swelling, chest pain, shortness of breath, mood changes.  Positive muscle aches  Objective  Blood pressure 112/76, pulse (!) 57, height 5\' 6"  (1.676 m), weight 134 lb (60.8 kg), SpO2 98 %.    General: No apparent distress alert and oriented x3 mood and affect normal, dressed appropriately.  HEENT: Pupils equal, extraocular movements intact  Respiratory: Patient's speak in full  sentences and does not appear short of breath  Cardiovascular: No lower extremity edema, non tender, no erythema  Skin: Warm dry intact with no signs of infection or rash on extremities or on axial skeleton.  Abdomen: Soft nontender  Neuro: Cranial nerves II through XII are intact, neurovascularly intact in all extremities with 2+ DTRs and 2+ pulses.  Lymph: No lymphadenopathy of posterior or anterior cervical chain or axillae bilaterally.  Gait normal with good balance and coordination.  MSK:  Non tender with full range of motion and good stability and symmetric strength and tone of  elbows, wrist, hip, knee and ankles bilaterally.  Neck: Inspection loss of lordosis. No palpable stepoffs. Negative Spurling's maneuver. Full neck range of motion Grip strength and sensation normal in bilateral hands Strength good C4 to T1 distribution No sensory change to C4 to T1 Negative Hoffman sign bilaterally Reflexes normal Tightness   Shoulder: left  Inspection reveals no abnormalities, atrophy or asymmetry. ROM is full in all planes. Rotator cuff strength normal throughout. No signs of impingement with negative Neer and Hawkin's tests, empty can sign. Speeds and Yergason's tests normal. No labral pathology noted with negative Obrien's, negative clunk and good stability.  Positive crossover noted with mild discomfort of the acromioclavicular joint Normal scapular function observed. No painful arc and no drop arm sign. No apprehension sign  Osteopathic findings  C2 flexed rotated and side bent right C6 flexed rotated and side bent left T7 extended rotated and side bent left L2 flexed rotated and side bent right Sacrum right on right    Impression and Recommendations:     This case required medical decision making of moderate complexity. The above documentation has been reviewed and is accurate and complete [name], DO       Note: This dictation was prepared with Dragon  dictation along with smaller phrase technology. Any transcriptional errors that result from this process are unintentional.

## 2019-06-27 ENCOUNTER — Other Ambulatory Visit: Payer: Self-pay

## 2019-06-27 ENCOUNTER — Ambulatory Visit (INDEPENDENT_AMBULATORY_CARE_PROVIDER_SITE_OTHER): Payer: BC Managed Care – PPO | Admitting: Family Medicine

## 2019-06-27 ENCOUNTER — Encounter: Payer: Self-pay | Admitting: Family Medicine

## 2019-06-27 VITALS — BP 112/76 | HR 57 | Ht 66.0 in | Wt 134.0 lb

## 2019-06-27 DIAGNOSIS — M999 Biomechanical lesion, unspecified: Secondary | ICD-10-CM | POA: Diagnosis not present

## 2019-06-27 DIAGNOSIS — S43102D Unspecified dislocation of left acromioclavicular joint, subsequent encounter: Secondary | ICD-10-CM | POA: Diagnosis not present

## 2019-06-27 DIAGNOSIS — M542 Cervicalgia: Secondary | ICD-10-CM

## 2019-06-27 DIAGNOSIS — D509 Iron deficiency anemia, unspecified: Secondary | ICD-10-CM | POA: Diagnosis not present

## 2019-06-27 DIAGNOSIS — Z0001 Encounter for general adult medical examination with abnormal findings: Secondary | ICD-10-CM | POA: Diagnosis not present

## 2019-06-27 MED ORDER — IBUPROFEN 600 MG PO TABS: 600.0000 mg | ORAL_TABLET | Freq: Three times a day (TID) | ORAL | 1 refills | Status: DC | PRN

## 2019-06-27 NOTE — Assessment & Plan Note (Signed)
Decision today to treat with OMT was based on Physical Exam  After verbal consent patient was treated with HVLA, ME, FPR techniques in cervical, thoracic, lumbar and sacral areas  Patient tolerated the procedure well with improvement in symptoms  Patient given exercises, stretches and lifestyle modifications  See medications in patient instructions if given  Patient will follow up in 4-8 weeks 

## 2019-06-27 NOTE — Assessment & Plan Note (Signed)
Improvement.  Doing well at this time.  Still some mild discomfort in the area.  Has made some progress though.  Follow-up again in 4 to 8 weeks

## 2019-06-27 NOTE — Assessment & Plan Note (Signed)
Stable- discussed HEP  Doing well in PT  RTC in 4-8 weeks

## 2019-06-27 NOTE — Patient Instructions (Signed)
See me again in 2 months °

## 2019-06-28 ENCOUNTER — Encounter: Payer: Self-pay | Admitting: Physical Therapy

## 2019-06-28 ENCOUNTER — Other Ambulatory Visit: Payer: Self-pay

## 2019-06-28 ENCOUNTER — Ambulatory Visit: Payer: BC Managed Care – PPO | Admitting: Physical Therapy

## 2019-06-28 DIAGNOSIS — M6281 Muscle weakness (generalized): Secondary | ICD-10-CM

## 2019-06-28 DIAGNOSIS — M25511 Pain in right shoulder: Secondary | ICD-10-CM | POA: Diagnosis not present

## 2019-06-28 DIAGNOSIS — G8929 Other chronic pain: Secondary | ICD-10-CM

## 2019-06-28 DIAGNOSIS — M5441 Lumbago with sciatica, right side: Secondary | ICD-10-CM | POA: Diagnosis not present

## 2019-06-28 NOTE — Therapy (Signed)
[facility] [address], [place], [place], [address] Phone: [phone]   Fax:  [phone]  Physical Therapy Treatment  Patient Details  Name: Gina [name] MRN: [id number] Date of Birth: [birth year] Referring Provider (PT): Dr. Charlann Boxer    Encounter Date: 06/28/2019  PT End of Session - 06/28/19 [id number]    Visit Number  5    Number of Visits  29    Date for PT Re-Evaluation  07/24/19    Authorization Type  29 of 30 remaining visits BCBS    PT Start Time  [phone]    PT Stop Time  0825    PT Time Calculation (min)  51 min    Activity Tolerance  Patient tolerated treatment well       Past Medical History:  Diagnosis Date  . Acute meniscal tear of knee   . Allergy   . Migraine     History reviewed. No pertinent surgical history.  There were no vitals filed for this visit.  Subjective Assessment - 06/28/19 0733    Subjective  I saw Dr. Tamala Julian yesterday.  Had some adjustments.  Left shoulder adduction is the most painful.  Overall 80% better.  My hip hurts with walking 10 minutes.  Medial scapular pain is better and the DN really helps.    Pertinent History  had left shoulder injection for small tear seen on U/S    Currently in Pain?  No/denies    Pain Score  0-No pain    Pain Location  Neck    Pain Orientation  Left                       [facility] Adult PT Treatment/Exercise - 06/28/19 0001      Lumbar Exercises: Stretches   Piriformis Stretch Limitations  quadruped lean stretch 3x 20 sec       Lumbar Exercises: Standing   Other Standing Lumbar Exercises  blue band diagonal extensions blue band    cues to avoid knee hyperextension    Other Standing Lumbar Exercises  glute medius stand tall 10x right/left       Shoulder Exercises: Stretch   Other Shoulder Stretches  foam roll thread the needle with foam roll 8x    Other Shoulder Stretches  lying on foam roll with pectoral stretch and UE  elevations       Moist Heat Therapy   Number Minutes Moist Heat  5 Minutes    Moist Heat Location  Shoulder;Hip      Manual Therapy   Soft tissue mobilization  periscapular muscles and upper trap; lumbar paraspinals, gluteals and piriformis        Trigger Point Dry Needling - 06/28/19 0001    Consent Given?  Yes    Upper Trapezius Response  Twitch reponse elicited;Palpable increased muscle length   bil    Rhomboids Response  Twitch response elicited;Palpable increased muscle length    Gluteus Maximus Response  Twitch response elicited;Palpable increased muscle length    Piriformis Response  Twitch response elicited;Palpable increased muscle length    Lumbar multifidi Response  Palpable increased muscle length        Primarily right side      PT Short Term Goals - 06/28/19 0831      PT SHORT TERM GOAL #1   Title  The patient will demonstrate basic self care strategies and compliance with initial HEP    Status  Achieved  PT SHORT TERM GOAL #2   Title  The patient will report a 30% reduction in right scapular region pain with lifting, pushing and pulling    Status  Achieved      PT SHORT TERM GOAL #3   Title  The patient will report a 30% reduction in right lumbar pain and with decreased right LE symptoms to 3x/week    Status  Achieved      PT SHORT TERM GOAL #4   Title  The patient will have improved lumbar extension to 25 degrees and left sidebending to 40 degrees needed for hiking and kayaking    Time  8    Period  Weeks    Status  On-going        PT Long Term Goals - 05/29/19 2014      PT LONG TERM GOAL #1   Title  The patient will be independent with safe self progression of HEP    Time  8    Period  Weeks    Status  New    Target Date  07/24/19      PT LONG TERM GOAL #2   Title  The patient will report a 60% reduction in right scapular pain for lifting and carrying medium to heavier objects    Time  8    Period  Weeks    Status  New      PT  LONG TERM GOAL #3   Title  The patient will report 3/10 pain or less with walking on an incline or up stairs    Time  8    Period  Weeks    Status  New      PT LONG TERM GOAL #4   Title  Lumbo/pelvic and right hip strength grossly 4+/5 to 5-/5 needed for recreational activities hiking and kayaking    Time  8    Period  Weeks    Status  New      PT LONG TERM GOAL #5   Title  FOTO functional outcome score improved to 20% limitation or less    Time  8    Period  Weeks    Status  New            Plan - 06/28/19 [id number]    Clinical Impression Statement  The patient has improved soft tissue mobility in periscapular and lumbo-hip musculature.  She needs frequent verbal cues to avoid compensatory knee hyperextension on right with single leg standing.  Decreased tender point size and number overall but still has upper trap, rhomboid and gluteal points.  Several twitch responses produced with DN which is a good prognostic indicator.  Good progress with STGs.    Rehab Potential  Good    PT Frequency  2x / week    PT Treatment/Interventions  ADLs/Self Care Home Management;Cryotherapy;Electrical Stimulation;Traction;Moist Heat;Therapeutic activities;Therapeutic exercise;Neuromuscular re-education;Manual techniques;Dry needling;Taping;Spinal Manipulations;Iontophoresis 4mg /ml Dexamethasone    PT Next Visit Plan  check lumbar ROM for STGS;   DN as needed to gluteals and periscapular muscles;  thoracic extension mobilizations;  lower and middle trap strengthening over ball , serratus punches,assess response to DN;  standing band around thighs;  SLS    PT Home Exercise Plan  Access Code: [email or web address]       Patient will benefit from skilled therapeutic intervention in order to improve the following deficits and impairments:  Decreased range of motion, Pain, Decreased strength, Impaired perceived functional ability, Postural dysfunction  Visit Diagnosis: 1. Chronic  right shoulder pain   2. Chronic  right-sided low back pain with right-sided sciatica   3. Muscle weakness (generalized)        Problem List Patient Active Problem List   Diagnosis Date Noted  . Greater trochanteric bursitis of right hip 09/12/2018  . Right lateral epicondylitis 02/16/2018  . Iron deficiency anemia 01/09/2018  . Nonallopathic lesion of thoracic region 02/26/2016  . Nonallopathic lesion of lumbar region 02/26/2016  . Right hamstring injury 02/11/2014  . Neck pain on right side 07/13/2013  . Nonallopathic lesion of cervical region 07/13/2013  . Patellar contusion 07/13/2013  . Acromioclavicular separation, type 1 12/19/2012  . Hip pain, right 02/02/2012  . Abnormality of gait 02/02/2012   Ruben Im, PT 06/28/19 8:33 AM Phone: [phone] Fax: [phone] Alvera Singh 06/28/2019, 8:32 AM  [facility] [address], [place], [place], [address] Phone: [phone]   Fax:  [phone]  Name: Annalene Schor MRN: [id number] Date of Birth: [birth year]

## 2019-07-05 ENCOUNTER — Ambulatory Visit: Payer: BC Managed Care – PPO | Admitting: Physical Therapy

## 2019-07-05 ENCOUNTER — Encounter: Payer: Self-pay | Admitting: Physical Therapy

## 2019-07-05 ENCOUNTER — Other Ambulatory Visit: Payer: Self-pay

## 2019-07-05 DIAGNOSIS — M25511 Pain in right shoulder: Secondary | ICD-10-CM | POA: Diagnosis not present

## 2019-07-05 DIAGNOSIS — M6281 Muscle weakness (generalized): Secondary | ICD-10-CM

## 2019-07-05 DIAGNOSIS — G8929 Other chronic pain: Secondary | ICD-10-CM

## 2019-07-05 DIAGNOSIS — Z0001 Encounter for general adult medical examination with abnormal findings: Secondary | ICD-10-CM | POA: Diagnosis not present

## 2019-07-05 DIAGNOSIS — L821 Other seborrheic keratosis: Secondary | ICD-10-CM | POA: Diagnosis not present

## 2019-07-05 DIAGNOSIS — M5441 Lumbago with sciatica, right side: Secondary | ICD-10-CM | POA: Diagnosis not present

## 2019-07-05 DIAGNOSIS — D229 Melanocytic nevi, unspecified: Secondary | ICD-10-CM | POA: Diagnosis not present

## 2019-07-05 NOTE — Therapy (Addendum)
[facility] [address], [place], [place], [address] Phone: [phone]   Fax:  [phone]  Physical Therapy Treatment/Discharge Summary   Patient Details  Name: Gina [name] MRN: [id number] Date of Birth: 04/03/1966 Referring Provider (PT): Dr. Charlann Boxer    Encounter Date: 07/05/2019  PT End of Session - 07/05/19 0833    Visit Number  6    Number of Visits  29    Date for PT Re-Evaluation  07/24/19    Authorization Type  29 of 30 remaining visits BCBS    PT Start Time  [id number]    PT Stop Time  0820    PT Time Calculation (min)  45 min    Activity Tolerance  Patient tolerated treatment well       Past Medical History:  Diagnosis Date  . Acute meniscal tear of knee   . Allergy   . Migraine     History reviewed. No pertinent surgical history.  There were no vitals filed for this visit.  Subjective Assessment - 07/05/19 0737    Subjective  Feeling pretty good.  No pain this AM.   Did good after DN.    Currently in Pain?  No/denies    Pain Score  1     Pain Location  Scapula    Pain Orientation  Right    Pain Score  0    Pain Location  Hip    Pain Orientation  Right         [facility] PT Assessment - 07/05/19 0001      AROM   Lumbar Flexion  80    Lumbar - Right Side Bend  45    Lumbar - Left Side Bend  40                   [facility] Adult PT Treatment/Exercise - 07/05/19 0001      Lumbar Exercises: Stretches   ITB Stretch  Right;Left;3 reps;20 seconds    Other Lumbar Stretch Exercise  2nd step  and doorway hip flexor stretch with UE reaches and rainbows.        Knee/Hip Exercises: Standing   Lunge Walking - Round Trips  2 laps carrying 7# weight in left hand     Other Standing Knee Exercises  cone touch on floor 10x WB on right     Other Standing Knee Exercises  left foot on wall dead lifts 10x       Moist Heat Therapy   Number Minutes Moist Heat  6 Minutes    Moist Heat Location   Shoulder;Hip      Manual Therapy   Soft tissue mobilization  periscapular muscles and upper trap; lumbar paraspinals, gluteals and piriformis        Trigger Point Dry Needling - 07/05/19 0001    Consent Given?  Yes    Other Dry Needling  right ITB     Rhomboids Response  Twitch response elicited;Palpable increased muscle length    Subscapularis Response  Palpable increased muscle length    Hamstring Response  Palpable increased muscle length    Gluteus Maximus Response  Twitch response elicited;Palpable increased muscle length    Piriformis Response  Twitch response elicited;Palpable increased muscle length             PT Short Term Goals - 07/05/19 1713      PT SHORT TERM GOAL #1   Title  The patient will demonstrate basic self care  strategies and compliance with initial HEP    Status  Achieved      PT SHORT TERM GOAL #2   Title  The patient will report a 30% reduction in right scapular region pain with lifting, pushing and pulling    Status  Achieved      PT SHORT TERM GOAL #3   Title  The patient will report a 30% reduction in right lumbar pain and with decreased right LE symptoms to 3x/week    Status  Achieved      PT SHORT TERM GOAL #4   Title  The patient will have improved lumbar extension to 25 degrees and left sidebending to 40 degrees needed for hiking and kayaking    Status  Achieved      PT SHORT TERM GOAL #5   Title  Increased right shoulder elevation and abduction to 155 degrees needed for overhead reaching    Status  On-going        PT Long Term Goals - 05/29/19 2014      PT LONG TERM GOAL #1   Title  The patient will be independent with safe self progression of HEP    Time  8    Period  Weeks    Status  New    Target Date  07/24/19      PT LONG TERM GOAL #2   Title  The patient will report a 60% reduction in right scapular pain for lifting and carrying medium to heavier objects    Time  8    Period  Weeks    Status  New      PT LONG  TERM GOAL #3   Title  The patient will report 3/10 pain or less with walking on an incline or up stairs    Time  8    Period  Weeks    Status  New      PT LONG TERM GOAL #4   Title  Lumbo/pelvic and right hip strength grossly 4+/5 to 5-/5 needed for recreational activities hiking and kayaking    Time  8    Period  Weeks    Status  New      PT LONG TERM GOAL #5   Title  FOTO functional outcome score improved to 20% limitation or less    Time  8    Period  Weeks    Status  New            Plan - 07/05/19 3716    Clinical Impression Statement  Improved lateral trunk flexion but still asymmetrical.  Fewer scapular and hip tender points and improving soft tissue mobility.  Pelvic drop and weakness noted on right LE with single leg standing and dead lifts.  Fewer cues needed to avoid knee hyperextension as a compensatory strategy.  Therapist closely monitoring response with all treatment interventions.    Personal Factors and Comorbidities  Past/Current Experience;Time since onset of injury/illness/exacerbation    Rehab Potential  Good    PT Frequency  2x / week    PT Duration  8 weeks    PT Treatment/Interventions  ADLs/Self Care Home Management;Cryotherapy;Electrical Stimulation;Traction;Moist Heat;Therapeutic activities;Therapeutic exercise;Neuromuscular re-education;Manual techniques;Dry needling;Taping;Spinal Manipulations;Iontophoresis 24m/ml Dexamethasone    PT Next Visit Plan  check  shoulder ROM for STGS;   DN as needed to gluteals, ITB and periscapular muscles;  glute medius strengthening;  scapular strengthening       Patient will benefit from skilled therapeutic intervention in order to  improve the following deficits and impairments:  Decreased range of motion, Pain, Decreased strength, Impaired perceived functional ability, Postural dysfunction  Visit Diagnosis: Chronic right shoulder pain  Chronic right-sided low back pain with right-sided sciatica  Muscle weakness  (generalized)    PHYSICAL THERAPY DISCHARGE SUMMARY  Visits from Start of Care: 6  Current functional level related to goals / functional outcomes: The patient called to cancel remaining visits b/c of insurance billing/financial reasons.     Remaining deficits: As above   Education / Equipment: HEP  Plan: Patient agrees to discharge.  Patient goals were partially met. Patient is being discharged due to the patient's request.  ?????         Problem List Patient Active Problem List   Diagnosis Date Noted  . Greater trochanteric bursitis of right hip 09/12/2018  . Right lateral epicondylitis 02/16/2018  . Iron deficiency anemia 01/09/2018  . Nonallopathic lesion of thoracic region 02/26/2016  . Nonallopathic lesion of lumbar region 02/26/2016  . Right hamstring injury 02/11/2014  . Neck pain on right side 07/13/2013  . Nonallopathic lesion of cervical region 07/13/2013  . Patellar contusion 07/13/2013  . Acromioclavicular separation, type 1 12/19/2012  . Hip pain, right 02/02/2012  . Abnormality of gait 02/02/2012   Ruben Im, PT 07/05/19 5:15 PM Phone: [phone] Fax: [phone] Alvera Singh 07/05/2019, 5:15 PM  [facility] [address] [place], [place], [address] Phone: [phone]   Fax:  [phone]  Name: Ambermarie Honeyman MRN: [id number] Date of Birth: 05-12-1966

## 2019-07-12 ENCOUNTER — Ambulatory Visit: Payer: BC Managed Care – PPO | Admitting: Physical Therapy

## 2019-07-18 DIAGNOSIS — R7989 Other specified abnormal findings of blood chemistry: Secondary | ICD-10-CM | POA: Diagnosis not present

## 2019-07-19 ENCOUNTER — Ambulatory Visit: Payer: BC Managed Care – PPO | Admitting: Physical Therapy

## 2019-07-24 ENCOUNTER — Ambulatory Visit: Payer: BC Managed Care – PPO | Admitting: Physical Therapy

## 2019-08-29 ENCOUNTER — Encounter: Payer: Self-pay | Admitting: Family Medicine

## 2019-08-29 ENCOUNTER — Ambulatory Visit: Payer: BC Managed Care – PPO | Admitting: Family Medicine

## 2019-08-29 ENCOUNTER — Other Ambulatory Visit: Payer: Self-pay

## 2019-08-29 VITALS — BP 104/68 | Ht 66.0 in | Wt 132.0 lb

## 2019-08-29 DIAGNOSIS — M542 Cervicalgia: Secondary | ICD-10-CM | POA: Diagnosis not present

## 2019-08-29 DIAGNOSIS — M999 Biomechanical lesion, unspecified: Secondary | ICD-10-CM | POA: Diagnosis not present

## 2019-08-29 NOTE — Assessment & Plan Note (Signed)
Decision today to treat with OMT was based on Physical Exam  After verbal consent patient was treated with HVLA, ME, FPR techniques in cervical, thoracic, lumbar and sacral areas  Patient tolerated the procedure well with improvement in symptoms  Patient given exercises, stretches and lifestyle modifications  See medications in patient instructions if given  Patient will follow up in 4-8 weeks 

## 2019-08-29 NOTE — Progress Notes (Signed)
[name] [address], [place] [address] Phone: [phone] Subjective:   [name], am serving as a scribe for Dr. Hulan Saas.  I'm seeing this patient by the request  of:    CC: Neck and R shoulder pain  [id number]    06/27/19: R shoulder:  Improvement.  Doing well at this time.  Still some mild discomfort in the area.  Has made some progress though.  Follow-up again in 4 to 8 weeks  Neck: Stable- discussed HEP  Doing well in PT  RTC in 4-8 weeks  Update- 08/29/19 Tatsuko Christians is a 53 y.o. female coming in with complaint of back pain. Patient quit going to therapy because of the cost. [facility] is costing more than what she was paying [facility]. Is doing well though. Likes getting the dry needling for pain relief.     Past Medical History:  Diagnosis Date  . Acute meniscal tear of knee   . Allergy   . Migraine    No past surgical history on file. Social History   Socioeconomic History  . Marital status: Significant Other    Spouse name: Not on file  . Number of children: Not on file  . Years of education: Not on file  . Highest education level: Not on file  Occupational History  . Not on file  Social Needs  . Financial resource strain: Not on file  . Food insecurity    Worry: Not on file    Inability: Not on file  . Transportation needs    Medical: Not on file    Non-medical: Not on file  Tobacco Use  . Smoking status: Never Smoker  . Smokeless tobacco: Never Used  Substance and Sexual Activity  . Alcohol use: Yes  . Drug use: No  . Sexual activity: Yes    Comment: with females  Lifestyle  . Physical activity    Days per week: Not on file    Minutes per session: Not on file  . Stress: Not on file  Relationships  . Social Herbalist on phone: Not on file    Gets together: Not on file    Attends religious service: Not on file    Active member of club or  organization: Not on file    Attends meetings of clubs or organizations: Not on file    Relationship status: Not on file  Other Topics Concern  . Not on file  Social History Narrative  . Not on file   No Known Allergies No family history on file.     Current Outpatient Medications (Analgesics):  .  ibuprofen (ADVIL) 600 MG tablet, Take 1 tablet (600 mg total) by mouth every 8 (eight) hours as needed.   Current Outpatient Medications (Other):  .  carisoprodol (SOMA) 350 MG tablet, Take 1 tablet (350 mg total) by mouth 3 (three) times daily as needed for muscle spasms. .  diazepam (VALIUM) 5 MG tablet, Take 1 tablet (5 mg total) by mouth every 12 (twelve) hours as needed. One tab by mouth, 2 hours before procedure. .  LamoTRIgine (LAMICTAL XR) 50 MG TB24 24 hour tablet, Take 1 tablet (50 mg total) by mouth daily. .  Vitamin D, Ergocalciferol, (DRISDOL) 50000 units CAPS capsule, Take 1 capsule (50,000 Units total) by mouth every 7 (seven) days.    Past medical history, social, surgical and family history all reviewed in electronic medical record.  No pertanent information unless stated regarding to the chief complaint.   Review of Systems:  No headache, visual changes, nausea, vomiting, diarrhea, constipation, dizziness, abdominal pain, skin rash, fevers, chills, night sweats, weight loss, swollen lymph nodes, body aches, joint swelling, muscle aches, chest pain, shortness of breath, mood changes.   Objective  Blood pressure 104/68, height 5\' 6"  (1.676 m), weight 132 lb (59.9 kg).    General: No apparent distress alert and oriented x3 mood and affect normal, dressed appropriately.  HEENT: Pupils equal, extraocular movements intact  Respiratory: Patient's speak in full sentences and does not appear short of breath  Cardiovascular: No lower extremity edema, non tender, no erythema  Skin: Warm dry intact with no signs of infection or rash on extremities or on axial skeleton.   Abdomen: Soft nontender  Neuro: Cranial nerves II through XII are intact, neurovascularly intact in all extremities with 2+ DTRs and 2+ pulses.  Lymph: No lymphadenopathy of posterior or anterior cervical chain or axillae bilaterally.  Gait normal with good balance and coordination.  MSK:  Non tender with full range of motion and good stability and symmetric strength and tone of shoulders, elbows, wrist, hip, knee and ankles bilaterally.  Neck: Inspection unremarkable. No palpable stepoffs. Negative Spurling's maneuver. Full neck range of motion Grip strength and sensation normal in bilateral hands Strength good C4 to T1 distribution No sensory change to C4 to T1 Negative Hoffman sign bilaterally Reflexes normal Tightness in the right rhomboid and trapezius  Back Exam:  Inspection: Unremarkable  Motion: Flexion 45 deg, Extension 25 deg, Side Bending to 35 deg bilaterally,  Rotation to 45 deg bilaterally  SLR laying: Negative  XSLR laying: Negative  Palpable tenderness: Mild tenderness more in the parascapular region right greater than left. FABER: negative. Sensory change: Gross sensation intact to all lumbar and sacral dermatomes.  Reflexes: 2+ at both patellar tendons, 2+ at achilles tendons, Babinski's downgoing.  Strength at foot  Plantar-flexion: 5/5 Dorsi-flexion: 5/5 Eversion: 5/5 Inversion: 5/5  Leg strength  Quad: 5/5 Hamstring: 5/5 Hip flexor: 5/5 Hip abductors: 5/5  Gait unremarkable.  Osteopathic findings  C2 flexed rotated and side bent right C6 flexed rotated and side bent left T3 extended rotated and side bent right inhaled third rib T9 extended rotated and side bent left L2 flexed rotated and side bent right Sacrum right on right    Impression and Recommendations:     This case required medical decision making of moderate complexity. The above documentation has been reviewed and is accurate and complete [name], DO       Note: This  dictation was prepared with Dragon dictation along with smaller phrase technology. Any transcriptional errors that result from this process are unintentional.

## 2019-08-29 NOTE — Assessment & Plan Note (Signed)
Has responded fairly well to osteopathic manipulation.  Continues not to have chronic pain noted.  Patient was doing very well with dry needling and we will try to refer to another side that can decrease cost of her.  We discussed icing regimen, home exercise, posture and ergonomics, follow-up again in 4 to 8 weeks.

## 2019-08-29 NOTE — Patient Instructions (Signed)
[facility] will call for PT See me again in 6-8 weeks

## 2019-09-05 DIAGNOSIS — D751 Secondary polycythemia: Secondary | ICD-10-CM | POA: Diagnosis not present

## 2019-09-05 DIAGNOSIS — R7989 Other specified abnormal findings of blood chemistry: Secondary | ICD-10-CM | POA: Diagnosis not present

## 2019-09-06 ENCOUNTER — Telehealth: Payer: Self-pay | Admitting: Family Medicine

## 2019-09-06 NOTE — Telephone Encounter (Signed)
Lmom for her to call the office and schedule a PT appt.

## 2019-09-27 DIAGNOSIS — Z124 Encounter for screening for malignant neoplasm of cervix: Secondary | ICD-10-CM | POA: Diagnosis not present

## 2019-09-27 DIAGNOSIS — Z6821 Body mass index (BMI) 21.0-21.9, adult: Secondary | ICD-10-CM | POA: Diagnosis not present

## 2019-09-27 DIAGNOSIS — Z1231 Encounter for screening mammogram for malignant neoplasm of breast: Secondary | ICD-10-CM | POA: Diagnosis not present

## 2019-09-27 DIAGNOSIS — Z01419 Encounter for gynecological examination (general) (routine) without abnormal findings: Secondary | ICD-10-CM | POA: Diagnosis not present

## 2019-09-27 DIAGNOSIS — Z113 Encounter for screening for infections with a predominantly sexual mode of transmission: Secondary | ICD-10-CM | POA: Diagnosis not present

## 2019-10-24 ENCOUNTER — Ambulatory Visit (INDEPENDENT_AMBULATORY_CARE_PROVIDER_SITE_OTHER): Payer: BC Managed Care – PPO | Admitting: Family Medicine

## 2019-10-24 ENCOUNTER — Encounter: Payer: Self-pay | Admitting: Family Medicine

## 2019-10-24 VITALS — BP 100/70 | HR 68 | Ht 66.0 in

## 2019-10-24 DIAGNOSIS — M542 Cervicalgia: Secondary | ICD-10-CM

## 2019-10-24 DIAGNOSIS — M999 Biomechanical lesion, unspecified: Secondary | ICD-10-CM | POA: Diagnosis not present

## 2019-10-24 DIAGNOSIS — M25551 Pain in right hip: Secondary | ICD-10-CM

## 2019-10-24 MED ORDER — DIAZEPAM 10 MG PO TABS: 10.0000 mg | ORAL_TABLET | Freq: Two times a day (BID) | ORAL | 0 refills | Status: DC | PRN

## 2019-10-24 NOTE — Assessment & Plan Note (Signed)
Decision today to treat with OMT was based on Physical Exam  After verbal consent patient was treated with HVLA, ME, FPR techniques in cervical, thoracic, lumbar and sacral areas  Patient tolerated the procedure well with improvement in symptoms  Patient given exercises, stretches and lifestyle modifications  See medications in patient instructions if given  Patient will follow up in 4-8 weeks 

## 2019-10-24 NOTE — Assessment & Plan Note (Signed)
Continues to respond well to osteopathic manipulation.  Discussed posture and ergonomics.  Discussed different changes in her work environment that could also be beneficial.  Continue to be active.  Follow-up again in 4 to 8 weeks

## 2019-10-24 NOTE — Progress Notes (Signed)
[name] [place], [place] [address] Phone: [phone] Subjective:   I [name] am serving as a Education administrator for Dr. Hulan Saas.  This visit occurred during the SARS-CoV-2 public health emergency.  Safety protocols were in place, including screening questions prior to the visit, additional usage of staff PPE, and extensive cleaning of exam room while observing appropriate contact time as indicated for disinfecting solutions.     CC: Right hip and back pain  [id number]   08/29/2019 Has responded fairly well to osteopathic manipulation.  Continues not to have chronic pain noted.  Patient was doing very well with dry needling and we will try to refer to another side that can decrease cost of her.  We discussed icing regimen, home exercise, posture and ergonomics, follow-up again in 4 to 8 weeks.  Update 10/24/2019 Breasha Bosarge is a 53 y.o. female coming in with complaint of back pain. Patient is here for OMT to treat her back pain. Patient states she is doing ok. Hasn't been doing PT.  Patient has been doing some of the home exercises but secondary to stress having more difficulty.    Past Medical History:  Diagnosis Date  . Acute meniscal tear of knee   . Allergy   . Migraine    No past surgical history on file. Social History   Socioeconomic History  . Marital status: Significant Other    Spouse name: Not on file  . Number of children: Not on file  . Years of education: Not on file  . Highest education level: Not on file  Occupational History  . Not on file  Tobacco Use  . Smoking status: Never Smoker  . Smokeless tobacco: Never Used  Substance and Sexual Activity  . Alcohol use: Yes  . Drug use: No  . Sexual activity: Yes    Comment: with females  Other Topics Concern  . Not on file  Social History Narrative  . Not on file   Social Determinants of Health   Financial Resource Strain:   . Difficulty of  Paying Living Expenses: Not on file  Food Insecurity:   . Worried About Charity fundraiser in the Last Year: Not on file  . Ran Out of Food in the Last Year: Not on file  Transportation Needs:   . Lack of Transportation (Medical): Not on file  . Lack of Transportation (Non-Medical): Not on file  Physical Activity:   . Days of Exercise per Week: Not on file  . Minutes of Exercise per Session: Not on file  Stress:   . Feeling of Stress : Not on file  Social Connections:   . Frequency of Communication with Friends and Family: Not on file  . Frequency of Social Gatherings with Friends and Family: Not on file  . Attends Religious Services: Not on file  . Active Member of Clubs or Organizations: Not on file  . Attends Archivist Meetings: Not on file  . Marital Status: Not on file   No Known Allergies No family history on file.     Current Outpatient Medications (Analgesics):  .  ibuprofen (ADVIL) 600 MG tablet, Take 1 tablet (600 mg total) by mouth every 8 (eight) hours as needed.   Current Outpatient Medications (Other):  .  carisoprodol (SOMA) 350 MG tablet, Take 1 tablet (350 mg total) by mouth 3 (three) times daily as needed for muscle spasms. .  diazepam (VALIUM) 10 MG tablet,  Take 1 tablet (10 mg total) by mouth every 12 (twelve) hours as needed. One tab by mouth, 2 hours before procedure. .  LamoTRIgine (LAMICTAL XR) 50 MG TB24 24 hour tablet, Take 1 tablet (50 mg total) by mouth daily. .  Vitamin D, Ergocalciferol, (DRISDOL) 50000 units CAPS capsule, Take 1 capsule (50,000 Units total) by mouth every 7 (seven) days.    Past medical history, social, surgical and family history all reviewed in electronic medical record.  No pertanent information unless stated regarding to the chief complaint.   Review of Systems:  No headache, visual changes, nausea, vomiting, diarrhea, constipation, dizziness, abdominal pain, skin rash, fevers, chills, night sweats, weight loss,  swollen lymph nodes, body aches, joint swelling, muscle aches, chest pain, shortness of breath, mood changes.   Objective  Blood pressure 100/70, pulse 68, height 5\' 6"  (1.676 m), SpO2 97 %. S   General: No apparent distress alert and oriented x3 mood and affect normal, dressed appropriately.  HEENT: Pupils equal, extraocular movements intact  Respiratory: Patient's speak in full sentences and does not appear short of breath  Cardiovascular: No lower extremity edema, non tender, no erythema  Skin: Warm dry intact with no signs of infection or rash on extremities or on axial skeleton.  Abdomen: Soft nontender  Neuro: Cranial nerves II through XII are intact, neurovascularly intact in all extremities with 2+ DTRs and 2+ pulses.  Lymph: No lymphadenopathy of posterior or anterior cervical chain or axillae bilaterally.  Gait normal with good balance and coordination.  MSK:  Non tender with full range of motion and good stability and symmetric strength and tone of shoulders, elbows, wrist, , knee and ankles bilaterally.  Hip exam does have some mild pain with internal range of motion but does have full range of motion.  Mild positive grind test.  Tightness of the hip flexor on the right compared to left noted.  Neurovascularly intact distally.  Negative straight leg test.  5-5 strength of the lower extremity.  Neck exam does have mild tightness noted.  Some pain in the parascapular region right greater than left.  Osteopathic findings  C2 flexed rotated and side bent right T9 extended rotated and side bent left L2 flexed rotated and side bent right Sacrum right on right    Impression and Recommendations:     This case required medical decision making of moderate complexity. The above documentation has been reviewed and is accurate and complete [name], DO       Note: This dictation was prepared with Dragon dictation along with smaller phrase technology. Any transcriptional  errors that result from this process are unintentional.

## 2019-10-24 NOTE — Patient Instructions (Addendum)
  [address]st floor [place], [place] [address] Phone [phone]  Good to see you See me again in 6 weeks

## 2019-10-24 NOTE — Assessment & Plan Note (Signed)
Continues have tightness of the hip flexor.  Discussed which activities to do which was to avoid.  Patient is to increase activity slowly again.  We discussed the possibility of an intra-articular injection in the hip to see how much of that is playing a role compared to the hip flexor tightness.  Patient wants to hold at the moment.  Will increase activity slowly.  Follow-up again in 4 to 8 weeks

## 2019-12-05 ENCOUNTER — Encounter: Payer: Self-pay | Admitting: Family Medicine

## 2019-12-05 ENCOUNTER — Ambulatory Visit: Payer: Self-pay | Admitting: Family Medicine

## 2019-12-05 ENCOUNTER — Other Ambulatory Visit: Payer: Self-pay

## 2019-12-05 VITALS — BP 122/84 | HR 70 | Ht 66.0 in | Wt 135.0 lb

## 2019-12-05 DIAGNOSIS — M999 Biomechanical lesion, unspecified: Secondary | ICD-10-CM | POA: Diagnosis not present

## 2019-12-05 DIAGNOSIS — M542 Cervicalgia: Secondary | ICD-10-CM

## 2019-12-05 DIAGNOSIS — S43102D Unspecified dislocation of left acromioclavicular joint, subsequent encounter: Secondary | ICD-10-CM

## 2019-12-05 NOTE — Assessment & Plan Note (Signed)
Chronic problem with stable.  Patient has multiple chronic pain problems that were addressed today.  We discussed icing regimen and home exercises, discussed ergonomics throughout the day.  Previous imagings including multiple ultrasounds of the shoulder were reviewed in patient's previous notes.  Patient will follow up with me again 6 to 8 weeks

## 2019-12-05 NOTE — Progress Notes (Signed)
[name] Phone: [phone] Subjective:   [name], am serving as a scribe for Dr. Hulan Saas. This visit occurred during the SARS-CoV-2 public health emergency.  Safety protocols were in place, including screening questions prior to the visit, additional usage of staff PPE, and extensive cleaning of exam room while observing appropriate contact time as indicated for disinfecting solutions.   I'm seeing this patient by the request  of:  Gina Rasmussen, MD  CC: Neck, shoulder pain  [id number]  Gina [name] is a 54 y.o. female coming in with complaint of back pain. Last seen on 10/24/2019 for OMT. Patient states that her left shoulder has started to bother her again. Previous history of AC joint separation and injection on 05/21/2019. No new changes in patient's back pain.  Reviewed previous ultrasounds patient's acromioclavicular joint with mild to moderate changes.      Past Medical History:  Diagnosis Date  . Acute meniscal tear of knee   . Allergy   . Migraine    No past surgical history on file. Social History   Socioeconomic History  . Marital status: Significant Other    Spouse name: Not on file  . Number of children: Not on file  . Years of education: Not on file  . Highest education level: Not on file  Occupational History  . Not on file  Tobacco Use  . Smoking status: Never Smoker  . Smokeless tobacco: Never Used  Substance and Sexual Activity  . Alcohol use: Yes  . Drug use: No  . Sexual activity: Yes    Comment: with females  Other Topics Concern  . Not on file  Social History Narrative  . Not on file   Social Determinants of Health   Financial Resource Strain:   . Difficulty of Paying Living Expenses: Not on file  Food Insecurity:   . Worried About Charity fundraiser in the Last Year: Not on file  . Ran Out of Food in the Last Year: Not on file   Transportation Needs:   . Lack of Transportation (Medical): Not on file  . Lack of Transportation (Non-Medical): Not on file  Physical Activity:   . Days of Exercise per Week: Not on file  . Minutes of Exercise per Session: Not on file  Stress:   . Feeling of Stress : Not on file  Social Connections:   . Frequency of Communication with Friends and Family: Not on file  . Frequency of Social Gatherings with Friends and Family: Not on file  . Attends Religious Services: Not on file  . Active Member of Clubs or Organizations: Not on file  . Attends Archivist Meetings: Not on file  . Marital Status: Not on file   No Known Allergies no known drug allergies No family history on file.  No family history of autoimmune     Current Outpatient Medications (Analgesics):  .  ibuprofen (ADVIL) 600 MG tablet, Take 1 tablet (600 mg total) by mouth every 8 (eight) hours as needed.   Current Outpatient Medications (Other):  .  carisoprodol (SOMA) 350 MG tablet, Take 1 tablet (350 mg total) by mouth 3 (three) times daily as needed for muscle spasms. .  diazepam (VALIUM) 10 MG tablet, Take 1 tablet (10 mg total) by mouth every 12 (twelve) hours as needed. One tab by mouth, 2 hours before procedure. .  LamoTRIgine (LAMICTAL XR) 50 MG TB24  24 hour tablet, Take 1 tablet (50 mg total) by mouth daily. .  Vitamin D, Ergocalciferol, (DRISDOL) 50000 units CAPS capsule, Take 1 capsule (50,000 Units total) by mouth every 7 (seven) days.   Reviewed prior external information including notes and imaging from  primary care provider As well as notes that were available from care everywhere and other healthcare systems.  Past medical history, social, surgical and family history all reviewed in electronic medical record.  No pertanent information unless stated regarding to the chief complaint.   Review of Systems:  No headache, visual changes, nausea, vomiting, diarrhea, constipation, dizziness,  abdominal pain, skin rash, fevers, chills, night sweats, weight loss, swollen lymph nodes, body aches, joint swelling, chest pain, shortness of breath, mood changes. POSITIVE muscle aches  Objective  There were no vitals taken for this visit.   General: No apparent distress alert and oriented x3 mood and affect normal, dressed appropriately.  HEENT: Pupils equal, extraocular movements intact  Respiratory: Patient's speak in full sentences and does not appear short of breath  Cardiovascular: No lower extremity edema, non tender, no erythema  Skin: Warm dry intact with no signs of infection or rash on extremities or on axial skeleton.  Abdomen: Soft nontender  Neuro: Cranial nerves II through XII are intact, neurovascularly intact in all extremities with 2+ DTRs and 2+ pulses.  Lymph: No lymphadenopathy of posterior or anterior cervical chain or axillae bilaterally.  Gait normal with good balance and coordination.  MSK:  Non tender with full range of motion and good stability and symmetric strength and tone of shoulders, elbows, wrist, hip, knee and ankles bilaterally.  Left shoulder exam shows the patient to chronic changes with some swelling noted over the acromioclavicular joint.  Positive crossover test.  Full range of motion is noted.  Tender to palpation over the [facility] joint.  Back Exam:   Inspection: Unremarkable  Motion: Flexion 45 deg, Extension 25 deg, Side Bending to 45 deg bilaterally,  Rotation to 45 deg bilaterally  SLR laying: Negative  XSLR laying: Negative  Palpable tenderness: Mild tenderness in the thoracolumbar juncture. FABER: negative. Sensory change: Gross sensation intact to all lumbar and sacral dermatomes.  Reflexes: 2+ at both patellar tendons, 2+ at achilles tendons, Babinski's downgoing.  Strength at foot  Plantar-flexion: 5/5 Dorsi-flexion: 5/5 Eversion: 5/5 Inversion: 5/5  Leg strength  Quad: 5/5 Hamstring: 5/5 Hip flexor: 5/5 Hip abductors: 5/5  Gait  unremarkable.  \Neck: Inspection mild loss of lordosis. No palpable stepoffs. Negative Spurling's maneuver. Full neck range of motion Grip strength and sensation normal in bilateral hands Strength good C4 to T1 distribution No sensory change to C4 to T1 Negative Hoffman sign bilaterally Reflexes normal Tightness noted in the parascapular region  Osteopathic findings  C2 flexed rotated and side bent right T9 extended rotated and side bent left L2 flexed rotated and side bent right Sacrum right on right  After verbal consent patient was prepped with alcohol swabs and with a 25-gauge half inch needle injected with a total of 0.5 cc of 0.5% Marcaine and 0.5 cc of Kenalog 40 mg/mL into the left acromioclavicular joint.  No blood loss.  Band-Aid placed.  Postinjection instructions given   Impression and Recommendations:     This case required medical decision making of moderate complexity. The above documentation has been reviewed and is accurate and complete [name], DO       Note: This dictation was prepared with Dragon dictation along with smaller phrase technology.  Any transcriptional errors that result from this process are unintentional.

## 2019-12-05 NOTE — Patient Instructions (Signed)
See me in 6-8 weeks 

## 2019-12-05 NOTE — Assessment & Plan Note (Signed)
Decision today to treat with OMT was based on Physical Exam  After verbal consent patient was treated with HVLA, ME, FPR techniques in cervical, thoracic,  lumbarareas  Patient tolerated the procedure well with improvement in symptoms  Patient given exercises, stretches and lifestyle modifications  See medications in patient instructions if given  Patient will follow up in 6-8 weeks

## 2019-12-05 NOTE — Assessment & Plan Note (Signed)
Repeat injection given today, tolerated the procedure well, discussed icing regimen and home exercises, discussed topical anti-inflammatories prescription.  Follow-up again in 4 to 8 weeks

## 2020-01-30 ENCOUNTER — Other Ambulatory Visit: Payer: Self-pay

## 2020-01-30 ENCOUNTER — Ambulatory Visit (INDEPENDENT_AMBULATORY_CARE_PROVIDER_SITE_OTHER): Payer: BC Managed Care – PPO | Admitting: Family Medicine

## 2020-01-30 ENCOUNTER — Encounter: Payer: Self-pay | Admitting: Family Medicine

## 2020-01-30 VITALS — BP 110/86 | HR 64 | Ht 66.0 in | Wt 137.0 lb

## 2020-01-30 DIAGNOSIS — M999 Biomechanical lesion, unspecified: Secondary | ICD-10-CM

## 2020-01-30 DIAGNOSIS — M7742 Metatarsalgia, left foot: Secondary | ICD-10-CM

## 2020-01-30 DIAGNOSIS — M542 Cervicalgia: Secondary | ICD-10-CM | POA: Diagnosis not present

## 2020-01-30 DIAGNOSIS — R269 Unspecified abnormalities of gait and mobility: Secondary | ICD-10-CM | POA: Diagnosis not present

## 2020-01-30 DIAGNOSIS — M7741 Metatarsalgia, right foot: Secondary | ICD-10-CM | POA: Diagnosis not present

## 2020-01-30 NOTE — Assessment & Plan Note (Signed)
Patient is having worsening metatarsalgia.  We discussed over-the-counter orthotics that did not seem to be improving.  No thicker orthotics do not seem to be helpful either at the moment including in shoes.  Will be referred to podiatry to see if they could potentially make a difference.  Follow-up with me as needed.

## 2020-01-30 NOTE — Assessment & Plan Note (Signed)
   Decision today to treat with OMT was based on Physical Exam  After verbal consent patient was treated with HVLA, ME, FPR techniques in cervical, thoracic,  lumbar and sacral areas, all areas are chronic   Patient tolerated the procedure well with improvement in symptoms  Patient given exercises, stretches and lifestyle modifications  See medications in patient instructions if given  Patient will follow up in 4-8 weeks 

## 2020-01-30 NOTE — Progress Notes (Signed)
[place] Phone: [phone] Subjective:   Gina Gina [name], am serving as a scribe for Dr. Hulan [name]. This visit occurred during the SARS-CoV-2 public health emergency.  Safety protocols were in place, including screening questions prior to the visit, additional usage of staff PPE, and extensive cleaning of exam room while observing appropriate contact time as indicated for disinfecting solutions.   I'm seeing this patient by the request  of:  Gina Rasmussen, MD  CC: Neck pain follow-up, foot pain  [id number]  Gina Gina [name] is a 54 y.o. female coming in with complaint of back pain. Last seen on 12/05/2019 for OMT. Patient states that her back pain has not changed since last visit. Gina [name] flare ups. Has been having pain in her feet. Previously discussed OTC orthotics which she has tried but did not find relief from use. Is interested in custom orthotics.  Patient has had the thicker EVA foam orthotics but states that they only fit in her workout shoes.      Past Medical History:  Diagnosis Date  . Acute meniscal tear of knee   . Allergy   . Migraine    Gina [name] past surgical history on file. Social History   Socioeconomic History  . Marital status: Significant Other    Spouse name: Not on file  . Number of children: Not on file  . Years of education: Not on file  . Highest education level: Not on file  Occupational History  . Not on file  Tobacco Use  . Smoking status: Never Smoker  . Smokeless tobacco: Never Used  Substance and Sexual Activity  . Alcohol use: Yes  . Drug use: Gina [name]  . Sexual activity: Yes    Comment: with females  Other Topics Concern  . Not on file  Social History Narrative  . Not on file   Social Determinants of Health   Financial Resource Strain:   . Difficulty of Paying Living Expenses:   Food Insecurity:   . Worried About Charity fundraiser in the Last Year:   . Arts development officer in the Last Year:   Transportation Needs:   . Film/video editor (Medical):   Marland Kitchen Lack of Transportation (Non-Medical):   Physical Activity:   . Days of Exercise per Week:   . Minutes of Exercise per Session:   Stress:   . Feeling of Stress :   Social Connections:   . Frequency of Communication with Friends and Family:   . Frequency of Social Gatherings with Friends and Family:   . Attends Religious Services:   . Active Member of Clubs or Organizations:   . Attends Archivist Meetings:   Marland Kitchen Marital Status:    Gina [name] Known Allergies Gina [name] family history on file.     Current Outpatient Medications (Analgesics):  .  ibuprofen (ADVIL) 600 MG tablet, Take 1 tablet (600 mg total) by mouth every 8 (eight) hours as needed.   Current Outpatient Medications (Other):  .  carisoprodol (SOMA) 350 MG tablet, Take 1 tablet (350 mg total) by mouth 3 (three) times daily as needed for muscle spasms. .  diazepam (VALIUM) 10 MG tablet, Take 1 tablet (10 mg total) by mouth every 12 (twelve) hours as needed. One tab by mouth, 2 hours before procedure. .  LamoTRIgine (LAMICTAL XR) 50 MG TB24 24 hour tablet, Take 1 tablet (50 mg total) by mouth daily. .  Vitamin D,  Ergocalciferol, (DRISDOL) 50000 units CAPS capsule, Take 1 capsule (50,000 Units total) by mouth every 7 (seven) days.   Reviewed prior external information including notes and imaging from  primary care provider As well as notes that were available from care everywhere and other healthcare systems.  Past medical history, social, surgical and family history all reviewed in electronic medical record.  Gina [name] unless stated regarding to the chief complaint.   Review of Systems:  Gina [name] headache, visual changes, nausea, vomiting, diarrhea, constipation, dizziness, abdominal pain, skin rash, fevers, chills, night sweats, weight loss, swollen lymph nodes, body aches, joint swelling, chest pain, shortness of breath,  mood changes. POSITIVE muscle aches  Objective  Blood pressure 110/86, pulse 64, height 5\' 6"  (1.676 m), weight 137 lb (62.1 kg), SpO2 99 %.   General: Gina [name] apparent distress alert and oriented x3 mood and affect normal, dressed appropriately.  HEENT: Pupils equal, extraocular movements intact  Respiratory: Patient's speak in full sentences and does not appear short of breath  Cardiovascular: Gina [name] lower extremity edema, non tender, Gina [name] erythema  Neuro: Cranial nerves II through XII are intact, neurovascularly intact in all extremities with 2+ DTRs and 2+ pulses.  Gait normal with good balance and coordination.  MSK:  Non tender with full range of motion and good stability and symmetric strength and tone of shoulders, elbows, wrist, hip, knee and ankles bilaterally.  Neck exam does show some tightness more of the parascapular region on the right side.  Patient does have some tightness and loss of range of motion in sidebending negative straight leg test.  Full range of motion of the shoulders.  Patient does have fairly large hypertrophy of the acromioclavicular joint on the left.  Osteopathic findings  C2 flexed rotated and side bent right C4 flexed rotated and side bent left C6 flexed rotated and side bent left T5 extended rotated and side bent right L2 flexed rotated and side bent right Sacrum right on right Pelvic shear left with anterior illium     Impression and Recommendations:     This case required medical decision making of moderate complexity. The above documentation has been reviewed and is accurate and complete Gina Pulley, DO       Note: This dictation was prepared with Dragon dictation along with smaller phrase technology. Any transcriptional errors that result from this process are unintentional.

## 2020-01-30 NOTE — Patient Instructions (Signed)
See me again in 6 weeks 

## 2020-01-30 NOTE — Assessment & Plan Note (Signed)
Chronic problem : Chronic mild exacerbation  interventions previously, including medication management: Discussed ibuprofen prescriptions if needed, once weekly vitamin D   Interventions this visit: Osteopathic manipulation, discussed ergonomics We discussed with patient the importance ergonomics, home exercises, icing regimen, and over-the-counter natural products.   Future considerations but will be based on evaluation and next visit: Patient has been responding very well to osteopathic manipulation, continuing to have some difficulty consider formal physical therapy    Return to clinic: 6 weeks.

## 2020-02-21 ENCOUNTER — Ambulatory Visit (INDEPENDENT_AMBULATORY_CARE_PROVIDER_SITE_OTHER): Payer: BC Managed Care – PPO

## 2020-02-21 ENCOUNTER — Other Ambulatory Visit: Payer: Self-pay

## 2020-02-21 ENCOUNTER — Encounter: Payer: Self-pay | Admitting: Podiatry

## 2020-02-21 ENCOUNTER — Other Ambulatory Visit: Payer: Self-pay | Admitting: Podiatry

## 2020-02-21 ENCOUNTER — Ambulatory Visit: Payer: BC Managed Care – PPO | Admitting: Podiatry

## 2020-02-21 VITALS — BP 122/80 | HR 58 | Temp 97.3°F | Resp 16

## 2020-02-21 DIAGNOSIS — M216X9 Other acquired deformities of unspecified foot: Secondary | ICD-10-CM

## 2020-02-21 DIAGNOSIS — M79671 Pain in right foot: Secondary | ICD-10-CM

## 2020-02-21 DIAGNOSIS — M779 Enthesopathy, unspecified: Secondary | ICD-10-CM

## 2020-02-21 DIAGNOSIS — M79672 Pain in left foot: Secondary | ICD-10-CM

## 2020-02-21 NOTE — Progress Notes (Signed)
   Subjective:    Patient ID: Gina [name], female    DOB: 1966/04/12, 54 y.o.   MRN: [id number]  HPI    Review of Systems  All other systems reviewed and are negative.      Objective:   Physical Exam        Assessment & Plan:

## 2020-02-21 NOTE — Progress Notes (Signed)
Subjective:   Patient ID: Gina [name], female   DOB: 54 y.o.   MRN: [id number]   HPI patient presents stating she has discomfort in her right over her left foot in the forefoot and states that she has a high arch and she has had history of orthotics over the years and would like to have a new pair of orthotics made at this time.  Patient does not smoke likes to be active    Review of Systems  All other systems reviewed and are negative.       Objective:  Physical Exam Vitals and nursing note reviewed.  Constitutional:      Appearance: She is well-developed.  Pulmonary:     Effort: Pulmonary effort is normal.  Musculoskeletal:        General: Normal range of motion.  Skin:    General: Skin is warm.  Neurological:     Mental Status: She is alert.     Neuro vascular status found to be intact muscle strength adequate with patient found to have  discomfort in the third metatarsal phalangeal joint right with discomfort in the second of a moderate nature.  Patient is found to have a relative high arch foot structure and does have some bony exposure bilateral diminishment of the fat pad that is occurred over the years.  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  Patient wants to have these made I do think that metatarsal pad been more of a sport type orthotic with cushion would be best and patient is referred to [facility] orthotist to have these made and will bring in old orthotics  X-rays indicate moderate high arch cavus foot structure with no other pathology or arthritic issues noted

## 2020-03-06 ENCOUNTER — Ambulatory Visit (INDEPENDENT_AMBULATORY_CARE_PROVIDER_SITE_OTHER): Payer: BC Managed Care – PPO | Admitting: Orthotics

## 2020-03-06 ENCOUNTER — Other Ambulatory Visit: Payer: Self-pay

## 2020-03-06 DIAGNOSIS — M778 Other enthesopathies, not elsewhere classified: Secondary | ICD-10-CM | POA: Diagnosis not present

## 2020-03-06 DIAGNOSIS — M216X9 Other acquired deformities of unspecified foot: Secondary | ICD-10-CM

## 2020-03-06 DIAGNOSIS — M779 Enthesopathy, unspecified: Secondary | ICD-10-CM

## 2020-03-06 DIAGNOSIS — M79671 Pain in right foot: Secondary | ICD-10-CM

## 2020-03-06 DIAGNOSIS — M79672 Pain in left foot: Secondary | ICD-10-CM

## 2020-03-06 NOTE — Progress Notes (Signed)
Patient came in today for evaluation/assessment custom foot orthotics.  Patient presents foot pain and discomfort associated with plantar fasciitis.  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 ...Goal is to "bring ground up" with contoured arch support, and 4 degree valgus RF and FF posting.     

## 2020-03-20 ENCOUNTER — Ambulatory Visit: Payer: BC Managed Care – PPO | Admitting: Orthotics

## 2020-03-20 ENCOUNTER — Ambulatory Visit: Payer: BC Managed Care – PPO | Admitting: Family Medicine

## 2020-03-20 ENCOUNTER — Other Ambulatory Visit: Payer: Self-pay

## 2020-03-20 DIAGNOSIS — M216X9 Other acquired deformities of unspecified foot: Secondary | ICD-10-CM

## 2020-03-20 NOTE — Progress Notes (Signed)
Patient came in today to pick up custom made foot orthotics.  The goals were accomplished and the patient reported no dissatisfaction with said orthotics.  Patient was advised of breakin period and how to report any issues. 

## 2020-04-03 ENCOUNTER — Encounter: Payer: Self-pay | Admitting: Family Medicine

## 2020-04-03 ENCOUNTER — Ambulatory Visit: Payer: BC Managed Care – PPO | Admitting: Family Medicine

## 2020-04-03 ENCOUNTER — Other Ambulatory Visit: Payer: Self-pay

## 2020-04-03 VITALS — BP 126/82 | HR 84 | Ht 66.0 in | Wt 134.0 lb

## 2020-04-03 DIAGNOSIS — M999 Biomechanical lesion, unspecified: Secondary | ICD-10-CM | POA: Diagnosis not present

## 2020-04-03 DIAGNOSIS — M542 Cervicalgia: Secondary | ICD-10-CM

## 2020-04-03 NOTE — Assessment & Plan Note (Signed)
   Decision today to treat with OMT was based on Physical Exam  After verbal consent patient was treated with HVLA, ME, FPR techniques in cervical, thoracic,  lumbar and sacral areas, all areas are chronic   Patient tolerated the procedure well with improvement in symptoms  Patient given exercises, stretches and lifestyle modifications  See medications in patient instructions if given  Patient will follow up in 4-8 weeks 

## 2020-04-03 NOTE — Patient Instructions (Signed)
Trim the orthotics See me again in 6-8 weeks

## 2020-04-03 NOTE — Assessment & Plan Note (Signed)
Neck pain, chronic mild exacerbation, stress plays a role.  Discussed ibuprofen that is prescription.  Discussed which activities to do which wants to avoid.  Discussed posture and ergonomics.  Patient is responding well to manipulation and will follow up again in 4 to 8 weeks

## 2020-04-03 NOTE — Progress Notes (Signed)
[place] [address] [place] Phone: [phone] Subjective:   I Gina [name] am serving as a Education administrator for Dr. Hulan Saas.  This visit occurred during the SARS-CoV-2 public health emergency.  Safety protocols were in place, including screening questions prior to the visit, additional usage of staff PPE, and extensive cleaning of exam room while observing appropriate contact time as indicated for disinfecting solutions.   I'm seeing this patient by the request  of:  Hayden Rasmussen, MD  CC: Neck pain follow-up  [id number]   01/30/2020 Patient is having worsening metatarsalgia.  We discussed over-the-counter orthotics that did not seem to be improving.  No thicker orthotics do not seem to be helpful either at the moment including in shoes.  Will be referred to podiatry to see if they could potentially make a difference.  Follow-up with me as needed.  Update 04/03/2020 Gina [name] is a 54 y.o. female coming in with complaint of foot pain and back pain. Last seen for OMT on 01/30/2020. Patient states she is doing better. Seen a podiatrist.  Patient states neck some mild tightness.  Nothing severe.  Did go to the beach and even with driving noticed in the neck a little bit but no radiation      Past Medical History:  Diagnosis Date  . Acute meniscal tear of knee   . Allergy   . Migraine    No past surgical history on file. Social History   Socioeconomic History  . Marital status: Significant Other    Spouse name: Not on file  . Number of children: Not on file  . Years of education: Not on file  . Highest education level: Not on file  Occupational History  . Not on file  Tobacco Use  . Smoking status: Never Smoker  . Smokeless tobacco: Never Used  Substance and Sexual Activity  . Alcohol use: Yes  . Drug use: No  . Sexual activity: Yes    Comment: with females  Other Topics Concern  . Not on file  Social  History Narrative  . Not on file   Social Determinants of Health   Financial Resource Strain:   . Difficulty of Paying Living Expenses:   Food Insecurity:   . Worried About Charity fundraiser in the Last Year:   . Arboriculturist in the Last Year:   Transportation Needs:   . Film/video editor (Medical):   Marland Kitchen Lack of Transportation (Non-Medical):   Physical Activity:   . Days of Exercise per Week:   . Minutes of Exercise per Session:   Stress:   . Feeling of Stress :   Social Connections:   . Frequency of Communication with Friends and Family:   . Frequency of Social Gatherings with Friends and Family:   . Attends Religious Services:   . Active Member of Clubs or Organizations:   . Attends Archivist Meetings:   Marland Kitchen Marital Status:    No Known Allergies No family history on file.  Current Outpatient Medications (Endocrine & Metabolic):  .  progesterone (PROMETRIUM) 100 MG capsule, progesterone micronized 100 mg capsule  Take 1 capsule every day by oral route.    Current Outpatient Medications (Analgesics):  .  ibuprofen (ADVIL) 600 MG tablet, Take 1 tablet (600 mg total) by mouth every 8 (eight) hours as needed.   Current Outpatient Medications (Other):  .  carisoprodol (SOMA) 350 MG tablet, Take 1  tablet (350 mg total) by mouth 3 (three) times daily as needed for muscle spasms. .  clonazePAM (KLONOPIN) 0.5 MG tablet, clonazepam 0.5 mg tablet .  diazepam (VALIUM) 10 MG tablet, Take 1 tablet (10 mg total) by mouth every 12 (twelve) hours as needed. One tab by mouth, 2 hours before procedure. .  LamoTRIgine (LAMICTAL XR) 50 MG TB24 24 hour tablet, Take 1 tablet (50 mg total) by mouth daily. Marland Kitchen  lamoTRIgine (LAMICTAL) 25 MG tablet, SMARTSIG:1 Tablet(s) By Mouth Every Evening .  Testosterone 20 % CREA, Compounded Testosterone Cream/Gel 2%  Testosterone Proprionate 2 mg/gram in gel or cream base  use small pea sized amount as directed once a week   Reviewed prior  external information including notes and imaging from  primary care provider As well as notes that were available from care everywhere and other healthcare systems.  Past medical history, social, surgical and family history all reviewed in electronic medical record.  No pertanent information unless stated regarding to the chief complaint.   Review of Systems:  No headache, visual changes, nausea, vomiting, diarrhea, constipation, dizziness, abdominal pain, skin rash, fevers, chills, night sweats, weight loss, swollen lymph nodes, body aches, joint swelling, chest pain, shortness of breath, mood changes. POSITIVE muscle aches  Objective  Blood pressure 126/82, pulse 84, height 5\' 6"  (1.676 m), weight 134 lb (60.8 kg), SpO2 98 %.   General: No apparent distress alert and oriented x3 mood and affect normal, dressed appropriately.  HEENT: Pupils equal, extraocular movements intact  Respiratory: Patient's speak in full sentences and does not appear short of breath  Cardiovascular: No lower extremity edema, non tender, no erythema  Neuro: Cranial nerves II through XII are intact, neurovascularly intact in all extremities with 2+ DTRs and 2+ pulses.  Gait normal with good balance and coordination.  MSK:  Non tender with full range of motion and good stability and symmetric strength and tone of shoulders, elbows, wrist, hip, knee and ankles bilaterally.  Neck exam shows mild loss of lordosis.  Some tender to palpation in the parascapular region right greater than left.  Mild trigger points noted in this area.  Osteopathic findings C2 flexed rotated and side bent right C4 flexed rotated and side bent left C6 flexed rotated and side bent right T3 extended rotated and side bent right inhaled third rib T7 extended rotated and side bent left L2 flexed rotated and side bent right Sacrum right on right    Impression and Recommendations:     This case required medical decision making of moderate  complexity. The above documentation has been reviewed and is accurate and complete [name], DO       Note: This dictation was prepared with Dragon dictation along with smaller phrase technology. Any transcriptional errors that result from this process are unintentional.

## 2020-05-16 ENCOUNTER — Other Ambulatory Visit: Payer: Self-pay

## 2020-05-16 ENCOUNTER — Ambulatory Visit: Payer: BC Managed Care – PPO | Admitting: Podiatry

## 2020-05-16 ENCOUNTER — Encounter: Payer: Self-pay | Admitting: Podiatry

## 2020-05-16 DIAGNOSIS — M779 Enthesopathy, unspecified: Secondary | ICD-10-CM

## 2020-05-16 DIAGNOSIS — M778 Other enthesopathies, not elsewhere classified: Secondary | ICD-10-CM

## 2020-05-19 NOTE — Progress Notes (Signed)
Subjective:   Patient ID: Gina [name], female   DOB: 54 y.o.   MRN: [id number]   HPI Patient presents stating the inserts are too high in the arch area and it seems to be giving her right knee pain   ROS      Objective:  Physical Exam  Neurovascular status intact with orthotics which appear to be fitting well do have some flexibility but giving her problems     Assessment:  Possibility for orthotics which may be slightly too high for her     Plan:  I recommended lowering the orthotics discussed this with her and also the difficulty of trying to switch her to do orthotics after a number of years.  Patient will be seen back when ready and I am giving to ped orthotist to have these modifications made

## 2020-05-26 ENCOUNTER — Ambulatory Visit: Payer: BC Managed Care – PPO | Admitting: Podiatry

## 2020-05-28 ENCOUNTER — Telehealth: Payer: Self-pay | Admitting: Podiatry

## 2020-05-28 NOTE — Telephone Encounter (Signed)
Pt called asking about her orthotics that were to be adjusted.  I returned call and explained that they were sent back to manufacturer to be adjusted and should be coming back some time late next week or the week after  and I would call pt when they come in.

## 2020-05-29 ENCOUNTER — Other Ambulatory Visit: Payer: Self-pay

## 2020-05-29 ENCOUNTER — Encounter: Payer: Self-pay | Admitting: Family Medicine

## 2020-05-29 ENCOUNTER — Ambulatory Visit: Payer: BC Managed Care – PPO | Admitting: Family Medicine

## 2020-05-29 VITALS — BP 120/90 | HR 70 | Ht 66.0 in | Wt 136.0 lb

## 2020-05-29 DIAGNOSIS — M542 Cervicalgia: Secondary | ICD-10-CM

## 2020-05-29 DIAGNOSIS — M999 Biomechanical lesion, unspecified: Secondary | ICD-10-CM | POA: Diagnosis not present

## 2020-05-29 MED ORDER — IBUPROFEN 600 MG PO TABS: 600.0000 mg | ORAL_TABLET | Freq: Three times a day (TID) | ORAL | 1 refills | Status: DC | PRN

## 2020-05-29 NOTE — Patient Instructions (Signed)
Good to see you Exercises for the shoulder  See me again in 7-8 weeks

## 2020-05-29 NOTE — Progress Notes (Signed)
[place] [address] [place] Phone: [phone] Subjective:   I [name] am serving as a Education administrator for Dr. Hulan Saas.  This visit occurred during the SARS-CoV-2 public health emergency.  Safety protocols were in place, including screening questions prior to the visit, additional usage of staff PPE, and extensive cleaning of exam room while observing appropriate contact time as indicated for disinfecting solutions.   I'm seeing this patient by the request  of:  Hayden Rasmussen, MD  CC: Back and neck pain follow-up  [id number]  Robin Petrakis is a 54 y.o. female coming in with complaint of back and neck pain Patient states she is doing well. Having issues with the right arm. States she can't undo her bra at times.  Nothing that is stopping her from activity but does have tightness from time to time.  Medications patient has been prescribed: 600 mg ibuprofen prescription strength  Taking: Yes         Reviewed prior external information including notes and imaging from previsou exam, outside providers and external EMR if available.   As well as notes that were available from care everywhere and other healthcare systems.  Past medical history, social, surgical and family history all reviewed in electronic medical record.  No pertanent information unless stated regarding to the chief complaint.   Past Medical History:  Diagnosis Date  . Acute meniscal tear of knee   . Allergy   . Migraine     No Known Allergies   Review of Systems:  No headache, visual changes, nausea, vomiting, diarrhea, constipation, dizziness, abdominal pain, skin rash, fevers, chills, night sweats, weight loss, swollen lymph nodes, body aches, joint swelling, chest pain, shortness of breath, mood changes. POSITIVE muscle aches  Objective  Blood pressure 120/90, pulse 70, height 5\' 6"  (1.676 m), weight 136 lb (61.7 kg), SpO2 94 %.    General: No apparent distress alert and oriented x3 mood and affect normal, dressed appropriately.  HEENT: Pupils equal, extraocular movements intact  Respiratory: Patient's speak in full sentences and does not appear short of breath  Cardiovascular: No lower extremity edema, non tender, no erythema  Neuro: Cranial nerves II through XII are intact, neurovascularly intact in all extremities with 2+ DTRs and 2+ pulses.  Gait normal with good balance and coordination.  MSK:  Non tender with full range of motion and good stability and symmetric strength and tone of shoulders, elbows, wrist, hip, knee and ankles bilaterally.  Back - Normal skin, Spine with normal alignment and no deformity.  No tenderness to vertebral process palpation.  Paraspinous muscles are not tender and without spasm.   Range of motion is full at neck and lumbar sacral regions  Osteopathic findings  C2 flexed rotated and side bent right C6 flexed rotated and side bent left T3 extended rotated and side bent right inhaled rib T9 extended rotated and side bent left L2 flexed rotated and side bent right Sacrum right on right       Assessment and Plan: Neck pain on right side Continue tightness.  Chronic problem with mild exacerbation.  Discussed ergonomics including a vertical mouse that could be beneficial.  Discussed home exercise and icing regimen.  Discussed avoiding certain activities.  Increase ergonomic awareness follow-up again 4 to 8 weeks     Nonallopathic problems  Decision today to treat with OMT was based on Physical Exam  After verbal consent patient was treated with HVLA, ME, FPR  techniques in cervical, rib, thoracic, lumbar, and sacral  areas  Patient tolerated the procedure well with improvement in symptoms  Patient given exercises, stretches and lifestyle modifications  See medications in patient instructions if given  Patient will follow up in 4-8 weeks      The above documentation has  been reviewed and is accurate and complete [name], DO       Note: This dictation was prepared with Dragon dictation along with smaller phrase technology. Any transcriptional errors that result from this process are unintentional.

## 2020-05-29 NOTE — Assessment & Plan Note (Signed)
Continue tightness.  Chronic problem with mild exacerbation.  Discussed ergonomics including a vertical mouse that could be beneficial.  Discussed home exercise and icing regimen.  Discussed avoiding certain activities.  Increase ergonomic awareness follow-up again 4 to 8 weeks

## 2020-06-24 ENCOUNTER — Ambulatory Visit: Payer: BC Managed Care – PPO | Admitting: Orthotics

## 2020-06-24 ENCOUNTER — Other Ambulatory Visit: Payer: Self-pay

## 2020-06-24 DIAGNOSIS — M216X9 Other acquired deformities of unspecified foot: Secondary | ICD-10-CM

## 2020-06-24 DIAGNOSIS — M779 Enthesopathy, unspecified: Secondary | ICD-10-CM

## 2020-06-24 NOTE — Progress Notes (Signed)
Sent f/o back to adjust the met pad and move it more distal; skive down proximal end of pad.

## 2020-07-03 ENCOUNTER — Telehealth: Payer: Self-pay | Admitting: Podiatry

## 2020-07-03 NOTE — Telephone Encounter (Signed)
Pt left message yesterday @ [phone] returning my call about orthotics in. She would like to just pick them up.  I returned call today and left message ok for pt to just stop in and pick them up.

## 2020-07-31 ENCOUNTER — Other Ambulatory Visit: Payer: Self-pay

## 2020-07-31 ENCOUNTER — Ambulatory Visit: Payer: BC Managed Care – PPO | Admitting: Physician Assistant

## 2020-07-31 ENCOUNTER — Encounter: Payer: Self-pay | Admitting: Physician Assistant

## 2020-07-31 DIAGNOSIS — Z1283 Encounter for screening for malignant neoplasm of skin: Secondary | ICD-10-CM

## 2020-07-31 DIAGNOSIS — Z87898 Personal history of other specified conditions: Secondary | ICD-10-CM

## 2020-07-31 DIAGNOSIS — L720 Epidermal cyst: Secondary | ICD-10-CM

## 2020-07-31 DIAGNOSIS — Z86018 Personal history of other benign neoplasm: Secondary | ICD-10-CM

## 2020-08-04 NOTE — Progress Notes (Signed)
   Follow-Up Visit   Subjective  Gina [name] is a 54 y.o. female who presents for the following: Annual Exam (bump around anus).   The following portions of the chart were reviewed this encounter and updated as appropriate: Tobacco  Allergies  Meds  Problems  Med Hx  Surg Hx  Fam Hx      Objective  Well appearing patient in no apparent distress; mood and affect are within normal limits.  A full examination was performed including scalp, head, eyes, ears, nose, lips, neck, chest, axillae, abdomen, back, buttocks, bilateral upper extremities, bilateral lower extremities, hands, feet, fingers, toes, fingernails, and toenails. All findings within normal limits unless otherwise noted below.  Objective  Head -to toe: No atypical nevi No signs of non-mole skin cancer.   Objective  Mid Back (2): All scars clear  Objective  perianal: White papule   Assessment & Plan  Screening exam for skin cancer Head -to toe  Yearly skin exams  History of atypical nevus (2) Mid Back  Yearly skin exam  Milium cyst perianal  Incision and Drainage - perianal Location: perianal  Informed Consent: Discussed risks (permanent scarring, light or dark discoloration, infection, pain, bleeding, bruising, redness, damage to adjacent structures, and recurrence of the lesion) and benefits of the procedure, as well as the alternatives.  Informed consent was obtained.  Preparation: The area was prepped with alcohol.  Anesthesia: none  Procedure Details: An incision was made overlying the lesion. The lesion drained : white keratin material.   The patient tolerated procedure well.  Total number of lesions drained: 1  Plan: The patient was instructed on post-op care. Recommend OTC analgesia as needed for pain.     I, Jaquarius Seder, PA-C, have reviewed all documentation's for this visit.  The documentation on 08/04/20 for the exam, diagnosis, procedures and orders are all accurate  and complete.

## 2020-08-05 NOTE — Progress Notes (Signed)
[place] [facility] Phone: [phone] Subjective:   [name], am serving as a scribe for Dr. Hulan Saas. This visit occurred during the SARS-CoV-2 public health emergency.  Safety protocols were in place, including screening questions prior to the visit, additional usage of staff PPE, and extensive cleaning of exam room while observing appropriate contact time as indicated for disinfecting solutions.   I'm seeing this patient by the request  of:  Hayden Rasmussen, MD  CC: Back pain and neck pain  [id number]  Brandyn Thien is a 54 y.o. female coming in with complaint of back and neck pain. OMT 05/29/2020. Patient states that she is having back pain due to her orthotics. They have been remolded a couple of times. Patient walked more over the weekend her lower back and leg muscles became sore.   Medications patient has been prescribed: Advil, Valium Taking: Intermittently         Reviewed prior external information including notes and imaging from previsou exam, outside providers and external EMR if available.   As well as notes that were available from care everywhere and other healthcare systems.  Past medical history, social, surgical and family history all reviewed in electronic medical record.  No pertanent information unless stated regarding to the chief complaint.   Past Medical History:  Diagnosis Date  . Acute meniscal tear of knee   . Allergy   . Migraine     No Known Allergies   Review of Systems:  No headache, visual changes, nausea, vomiting, diarrhea, constipation, dizziness, abdominal pain, skin rash, fevers, chills, night sweats, weight loss, swollen lymph nodes, body aches, joint swelling, chest pain, shortness of breath, mood changes. POSITIVE muscle aches  Objective  Blood pressure 112/74, pulse 62, height 5\' 6"  (1.676 m), weight 139 lb (63 kg), SpO2 99 %.   General: No  apparent distress alert and oriented x3 mood and affect normal, dressed appropriately.  HEENT: Pupils equal, extraocular movements intact  Respiratory: Patient's speak in full sentences and does not appear short of breath  Cardiovascular: No lower extremity edema, non tender, no erythema  Neuro: Cranial nerves II through XII are intact, neurovascularly intact in all extremities with 2+ DTRs and 2+ pulses.  Gait normal with good balance and coordination.  MSK:  Non tender with full range of motion and good stability and symmetric strength and tone of shoulders, elbows, wrist, hip, knee and ankles bilaterally.  Back -low back exam does show some mild tightness noted in the paraspinal musculature lumbar spine left greater than right.  Tightness in the right periscapular region noted as well.  Neck has some limited sidebending to the right.  Questionable slipped rib of the T3 on the right side  Osteopathic findings  C6 flexed rotated and side bent right T3 extended rotated and side bent right inhaled rib T5 extended rotated and side bent left L1 flexed rotated and side bent right Sacrum right on right     Assessment and Plan: Neck pain on right side Continue mild pain on the right side.  Chronic problem with mild exacerbation.  Patient had a refill evaluation and has been using it very sparingly.  Does have Soma, discussed icing regimen and home exercises.  Increase activity slowly.  Follow-up again in 4 to 8 weeks  Abnormality of gait Patient is seen podiatry, continue to work with the orthotics.  Hoping that they will make adjustments patient will notice some  improvement with this and the low back pain.     Nonallopathic problems  Decision today to treat with OMT was based on Physical Exam  After verbal consent patient was treated with HVLA, ME, FPR techniques in cervical, rib, thoracic, lumbar, and sacral  areas  Patient tolerated the procedure well with improvement in  symptoms  Patient given exercises, stretches and lifestyle modifications  See medications in patient instructions if given  Patient will follow up in 4-8 weeks      The above documentation has been reviewed and is accurate and complete [name], DO       Note: This dictation was prepared with Dragon dictation along with smaller phrase technology. Any transcriptional errors that result from this process are unintentional.

## 2020-08-06 ENCOUNTER — Other Ambulatory Visit: Payer: Self-pay

## 2020-08-06 ENCOUNTER — Encounter: Payer: Self-pay | Admitting: Family Medicine

## 2020-08-06 ENCOUNTER — Ambulatory Visit (INDEPENDENT_AMBULATORY_CARE_PROVIDER_SITE_OTHER): Payer: BC Managed Care – PPO | Admitting: Family Medicine

## 2020-08-06 VITALS — BP 112/74 | HR 62 | Ht 66.0 in | Wt 139.0 lb

## 2020-08-06 DIAGNOSIS — R269 Unspecified abnormalities of gait and mobility: Secondary | ICD-10-CM | POA: Diagnosis not present

## 2020-08-06 DIAGNOSIS — M542 Cervicalgia: Secondary | ICD-10-CM | POA: Diagnosis not present

## 2020-08-06 DIAGNOSIS — M999 Biomechanical lesion, unspecified: Secondary | ICD-10-CM

## 2020-08-06 MED ORDER — DIAZEPAM 10 MG PO TABS: 10.0000 mg | ORAL_TABLET | Freq: Two times a day (BID) | ORAL | 0 refills | Status: DC | PRN

## 2020-08-06 NOTE — Assessment & Plan Note (Signed)
Patient is seen podiatry, continue to work with the orthotics.  Hoping that they will make adjustments patient will notice some improvement with this and the low back pain.

## 2020-08-06 NOTE — Patient Instructions (Signed)
Lets see if new mouse does better Check with Dr. Paulla Dolly if any other adjustments can be made otherwise we have plan B Would love to meet mom See me in 6 weeks

## 2020-08-06 NOTE — Assessment & Plan Note (Signed)
Continue mild pain on the right side.  Chronic problem with mild exacerbation.  Patient had a refill evaluation and has been using it very sparingly.  Does have Soma, discussed icing regimen and home exercises.  Increase activity slowly.  Follow-up again in 4 to 8 weeks

## 2020-08-18 ENCOUNTER — Ambulatory Visit: Payer: BC Managed Care – PPO | Admitting: Family Medicine

## 2020-09-17 NOTE — Progress Notes (Signed)
[name] [place] Phone: [phone] Subjective:   Rito Ehrlich, am serving as a scribe for Dr. Hulan Saas. This visit occurred during the SARS-CoV-2 public health emergency.  Safety protocols were in place, including screening questions prior to the visit, additional usage of staff PPE, and extensive cleaning of exam room while observing appropriate contact time as indicated for disinfecting solutions.   I'm seeing this patient by the request  of:  Hayden Rasmussen, MD  CC:   [id number]  Guillermo Difrancesco is a 54 y.o. female coming in with complaint of back and neck pain. OMT 08/06/2020. Patient states doing pretty well wants to talk about seeing a physical therapist. States that she is having problems with both her wrists R>L.   Medications patient has been prescribed: IBU 600mg   Taking: Intermittently usually does take the Valium intermittently and feels like she does need some more for nighttime relief.         Reviewed prior external information including notes and imaging from previsou exam, outside providers and external EMR if available.   As well as notes that were available from care everywhere and other healthcare systems.  Past medical history, social, surgical and family history all reviewed in electronic medical record.  No pertanent information unless stated regarding to the chief complaint.   Past Medical History:  Diagnosis Date  . Acute meniscal tear of knee   . Allergy   . Migraine     No Known Allergies   Review of Systems:  No headache, visual changes, nausea, vomiting, diarrhea, constipation, dizziness, abdominal pain, skin rash, fevers, chills, night sweats, weight loss, swollen lymph nodes, body aches, joint swelling, chest pain, shortness of breath, mood changes. POSITIVE muscle aches  Objective  Blood pressure 118/82, pulse 73, height 5\' 6"  (1.676 m), weight 137 lb (62.1  kg), SpO2 98 %.   General: No apparent distress alert and oriented x3 mood and affect normal, dressed appropriately.  HEENT: Pupils equal, extraocular movements intact  Respiratory: Patient's speak in full sentences and does not appear short of breath  Cardiovascular: No lower extremity edema, non tender, no erythema  Neuro: Cranial nerves II through XII are intact, neurovascularly intact in all extremities with 2+ DTRs and 2+ pulses.  Gait normal with good balance and coordination.  MSK:  Non tender with full range of motion and good stability and symmetric strength and tone of shoulders, elbows, wrist, hip, knee and ankles bilaterally.  Neck exam shows that patient does have some mild loss of lordosis.  Tightness noted on the right side of the neck and subtle left.  Negative Spurling's. Foot exam shows the patient does have mild pes planus.  No hallux limitus noted.  Otherwise fairly unremarkable  Osteopathic findings  C5 flexed rotated and side bent right T9 extended rotated and side bent left L2 flexed rotated and side bent right Sacrum right on right       Assessment and Plan:  Neck pain on right side Chronic problem with mild exacerbation.  Discussed icing regimen and home exercises.  Refilled patient's Valium patient understands fully not to mix with Klonopin or any type of alcohol.  Patient uses it very seldomly.  Continues to increase activity.  Follow-up again 4 to 8 weeks.  Abnormality of gait Patient has had difficulty for some time with the podiatry and will send to physical therapy to have custom orthotics made that I think will be more  beneficial.    Nonallopathic problems  Decision today to treat with OMT was based on Physical Exam  After verbal consent patient was treated with HVLA, ME, FPR techniques in cervical, rib, thoracic, lumbar, and sacral  areas  Patient tolerated the procedure well with improvement in symptoms  Patient given exercises, stretches and  lifestyle modifications  See medications in patient instructions if given  Patient will follow up in 4-8 weeks      The above documentation has been reviewed and is accurate and complete [name], DO       Note: This dictation was prepared with Dragon dictation along with smaller phrase technology. Any transcriptional errors that result from this process are unintentional.

## 2020-09-19 ENCOUNTER — Other Ambulatory Visit: Payer: Self-pay

## 2020-09-19 ENCOUNTER — Ambulatory Visit: Payer: BC Managed Care – PPO | Admitting: Family Medicine

## 2020-09-19 ENCOUNTER — Encounter: Payer: Self-pay | Admitting: Family Medicine

## 2020-09-19 VITALS — BP 118/82 | HR 73 | Ht 66.0 in | Wt 137.0 lb

## 2020-09-19 DIAGNOSIS — M999 Biomechanical lesion, unspecified: Secondary | ICD-10-CM

## 2020-09-19 DIAGNOSIS — M542 Cervicalgia: Secondary | ICD-10-CM | POA: Diagnosis not present

## 2020-09-19 DIAGNOSIS — R269 Unspecified abnormalities of gait and mobility: Secondary | ICD-10-CM | POA: Diagnosis not present

## 2020-09-19 DIAGNOSIS — M79672 Pain in left foot: Secondary | ICD-10-CM

## 2020-09-19 DIAGNOSIS — M79671 Pain in right foot: Secondary | ICD-10-CM

## 2020-09-19 MED ORDER — DIAZEPAM 10 MG PO TABS: 10.0000 mg | ORAL_TABLET | Freq: Two times a day (BID) | ORAL | 0 refills | Status: DC | PRN

## 2020-09-19 MED ORDER — IBUPROFEN 600 MG PO TABS: 600.0000 mg | ORAL_TABLET | Freq: Three times a day (TID) | ORAL | 1 refills | Status: DC | PRN

## 2020-09-19 NOTE — Patient Instructions (Addendum)
Good to see you Wrap your wrist for work PT for orthotics Have fun in [place] See me again in 7-8 weeks

## 2020-09-19 NOTE — Assessment & Plan Note (Signed)
Patient has had difficulty for some time with the podiatry and will send to physical therapy to have custom orthotics made that I think will be more beneficial.

## 2020-09-19 NOTE — Assessment & Plan Note (Signed)
Chronic problem with mild exacerbation.  Discussed icing regimen and home exercises.  Refilled patient's Valium patient understands fully not to mix with Klonopin or any type of alcohol.  Patient uses it very seldomly.  Continues to increase activity.  Follow-up again 4 to 8 weeks.

## 2020-10-13 ENCOUNTER — Other Ambulatory Visit: Payer: Self-pay

## 2020-10-13 ENCOUNTER — Ambulatory Visit (INDEPENDENT_AMBULATORY_CARE_PROVIDER_SITE_OTHER): Payer: BC Managed Care – PPO | Admitting: Physical Therapy

## 2020-10-13 DIAGNOSIS — M25572 Pain in left ankle and joints of left foot: Secondary | ICD-10-CM | POA: Diagnosis not present

## 2020-10-13 DIAGNOSIS — M25571 Pain in right ankle and joints of right foot: Secondary | ICD-10-CM | POA: Diagnosis not present

## 2020-10-13 DIAGNOSIS — M542 Cervicalgia: Secondary | ICD-10-CM | POA: Diagnosis not present

## 2020-10-23 ENCOUNTER — Other Ambulatory Visit: Payer: Self-pay

## 2020-10-23 ENCOUNTER — Ambulatory Visit (INDEPENDENT_AMBULATORY_CARE_PROVIDER_SITE_OTHER): Payer: BC Managed Care – PPO | Admitting: Physical Therapy

## 2020-10-23 ENCOUNTER — Encounter: Payer: BC Managed Care – PPO | Admitting: Physical Therapy

## 2020-10-23 DIAGNOSIS — M542 Cervicalgia: Secondary | ICD-10-CM

## 2020-10-23 DIAGNOSIS — M25572 Pain in left ankle and joints of left foot: Secondary | ICD-10-CM

## 2020-10-23 DIAGNOSIS — M25571 Pain in right ankle and joints of right foot: Secondary | ICD-10-CM

## 2020-10-26 ENCOUNTER — Encounter: Payer: Self-pay | Admitting: Physical Therapy

## 2020-10-26 NOTE — Patient Instructions (Signed)
Gastroc and soleus stretch at wall 30 sec x 3 each, 2x/day

## 2020-10-26 NOTE — Therapy (Signed)
[facility] [address] [place], [place], [address] Phone: [phone]   Fax:  [phone]  Physical Therapy Treatment  Patient Details  Name: Gina [name] MRN: [id number] Date of Birth: 1966-06-12 Referring Provider (PT): Charlann Boxer   Encounter Date: 10/23/2020   PT End of Session - 10/26/20 2122    Visit Number 2    Number of Visits 12    Date for PT Re-Evaluation 11/24/20    Authorization Type BCBS    PT Start Time 0803    PT Stop Time 0845    PT Time Calculation (min) 42 min    Activity Tolerance Patient tolerated treatment well    Behavior During Therapy [facility] for tasks assessed/performed           Past Medical History:  Diagnosis Date  . Acute meniscal tear of knee   . Allergy   . Migraine     No past surgical history on file.  There were no vitals filed for this visit.   Subjective Assessment - 10/26/20 2120    Subjective Pt here today for orthotic fabrication. States no pain in mets in past week. Brought running/exercise and work shoes.    Currently in Pain? No/denies    Pain Score 0-No pain                             [facility] Adult PT Treatment/Exercise - 10/26/20 2126      Self-Care   Self-Care Other Self-Care Comments    Other Self-Care Comments  Fitted with custom orthotic:  sz 8 next step, will add met pad next visit.      Exercises   Exercises Ankle      Ankle Exercises: Stretches   Soleus Stretch 3 reps;30 seconds    Gastroc Stretch 3 reps;30 seconds                  PT Education - 10/26/20 2121    Education Details PT POC, exam findings, HEP,    Person(s) Educated Patient    Methods Explanation;Demonstration;Tactile cues;Handout;Verbal cues    Comprehension Verbalized understanding;Returned demonstration;Verbal cues required;Tactile cues required;Need further instruction            PT Short Term Goals - 10/26/20 0835      PT SHORT TERM GOAL #1   Title  PT to be independent with initial HEP    Time 3    Period Weeks    Status New    Target Date 11/03/20             PT Long Term Goals - 10/26/20 0835      PT LONG TERM GOAL #1   Title Pt will be independent with final HEP for neck and foot pain    Time 6    Period Weeks    Status New    Target Date 11/24/20      PT LONG TERM GOAL #2   Title Pt to be complaint with foot wear and /or orthotics as appropriate for her foot type, with good fit and comfort    Time 6    Period Weeks    Status New    Target Date 11/24/20      PT LONG TERM GOAL #3   Title Pt to report decreased pain in neck and feet, to 0-2/10 with activity    Time 6    Period Weeks    Status New  Target Date 11/24/20      PT LONG TERM GOAL #5   Period Months                 Plan - 10/26/20 2123    Clinical Impression Statement Custom orthotic fabricated today. Pt with good fit and comfort. Will add met pad next visit, pt likley to benefit from met pad due to previous met pain. Added stretching for gastroc/soleus and DF. Pt with appropriate shoes for orthotics , but did discuss optimal footwear when pt ready to update. Pt educated on orthotic fit, use, precautions and wear time. Pt will benefit from continued care.    Examination-Activity Limitations Locomotion Level;Stand    Examination-Participation Restrictions Cleaning;Occupation;Community Activity;Yard Work;Shop    Stability/Clinical Decision Making Stable/Uncomplicated    Rehab Potential Good    PT Frequency 2x / week    PT Duration 6 weeks    PT Treatment/Interventions ADLs/Self Care Home Management;Cryotherapy;Electrical Stimulation;DME Instruction;Ultrasound;Traction;Moist Heat;Iontophoresis 62m/ml Dexamethasone;Gait training;Stair training;Functional mobility training;Therapeutic activities;Therapeutic exercise;Balance training;Orthotic Fit/Training;Patient/family education;Neuromuscular re-education;Manual techniques;Passive range of  motion;Vasopneumatic Device;Dry needling;Spinal Manipulations;Joint Manipulations;Taping    Consulted and Agree with Plan of Care Patient           Patient will benefit from skilled therapeutic intervention in order to improve the following deficits and impairments:  Difficulty walking,Increased muscle spasms,Decreased activity tolerance,Pain,Impaired flexibility,Improper body mechanics,Decreased mobility,Decreased strength  Visit Diagnosis: Pain in left ankle and joints of left foot  Pain in right ankle and joints of right foot  Cervicalgia     Problem List Patient Active Problem List   Diagnosis Date Noted  . Greater trochanteric bursitis of right hip 09/12/2018  . Right lateral epicondylitis 02/16/2018  . Iron deficiency anemia 01/09/2018  . Nonallopathic lesion of thoracic region 02/26/2016  . Nonallopathic lesion of lumbar region 02/26/2016  . Right hamstring injury 02/11/2014  . Neck pain on right side 07/13/2013  . Nonallopathic lesion of cervical region 07/13/2013  . Patellar contusion 07/13/2013  . Acromioclavicular separation, type 1 12/19/2012  . Hip pain, right 02/02/2012  . Abnormality of gait 02/02/2012   LLyndee Hensen PT, DPT 9:28 PM  10/26/20   [facility] [place] [address]Phone: [phone]  Fax:  [phone] Name: Gina BachichaMRN: [id number] of Birth: [birth year]

## 2020-10-26 NOTE — Therapy (Signed)
[facility] [address] [place], [place], [address] Phone: [phone]   Fax:  [phone]  Physical Therapy Evaluation  Patient Details  Name: Gina [name] MRN: [id number] Date of Birth: 10-02-1966 Referring Provider (PT): Charlann Boxer   Encounter Date: 10/13/2020   PT End of Session - 10/26/20 0833    Visit Number 1    Number of Visits 12    Date for PT Re-Evaluation 11/24/20    Authorization Type BCBS    PT Start Time [phone]    PT Stop Time 0930    PT Time Calculation (min) 44 min    Activity Tolerance Patient tolerated treatment well    Behavior During Therapy [facility] for tasks assessed/performed           Past Medical History:  Diagnosis Date  . Acute meniscal tear of knee   . Allergy   . Migraine     History reviewed. No pertinent surgical history.  There were no vitals filed for this visit.    Subjective Assessment - 10/26/20 0827    Subjective PT states ongoing foot pain. Has worn orthotics for years. She has had pl fasc, now has pain only in mets at times. Pt has good walking sneakers, but wearing work shoes today. Works at [employer], also [employer]. PT would like new orthotics, currently wearing 3/4 length that are very worn out.    Limitations Standing;Walking    Patient Stated Goals decreased pain.    Currently in Pain? Yes    Pain Score 2     Pain Location Foot    Pain Orientation Left;Right    Pain Descriptors / Indicators Aching    Pain Type Chronic pain    Pain Onset More than a month ago    Pain Frequency Intermittent    Aggravating Factors  increased standing activity              [facility] PT Assessment - 10/26/20 0001      Assessment   Medical Diagnosis Bil foot pain, neck pain    Referring Provider (PT) Charlann Boxer    Prior Therapy no      Balance Screen   Has the patient fallen in the past 6 months No      Prior Function   Level of Independence Independent      Cognition    Overall Cognitive Status Within Functional Limits for tasks assessed      Posture/Postural Control   Posture Comments Head: mild fwd head;   Feet: Fairly neutral foot, good arch preservation,      ROM / Strength   AROM / PROM / Strength AROM;PROM;Strength      AROM   Overall AROM Comments cervical ROM: WNL: mild DF loss bilaterally with stiffness in STJ for DF      Strength   Overall Strength Comments Postural/scapular: 4-/5 ;  Ankle:4/5      Palpation   Palpation comment Tightness and soreness in bil sub occipitals, and cervical paraspinals,  Mild soreness into mets 2-4 at times. - no pain in mets today -                      Objective measurements completed on examination: See above findings.       [facility] Adult PT Treatment/Exercise - 10/26/20 0001      Manual Therapy   Manual Therapy Joint mobilization;Soft tissue mobilization;Manual Traction    Joint Mobilization Bil DF ankle mobs; manual  gastroc stretch;    Manual Traction light cervical distraction 10 sec x 8; SOR;                  PT Education - 10/26/20 3614    Education Details Discussed orthotic fabrication, PT POC, Exam findings,    Person(s) Educated Patient    Methods Explanation;Demonstration;Verbal cues    Comprehension Verbalized understanding;Returned demonstration;Verbal cues required            PT Short Term Goals - 10/26/20 0835      PT SHORT TERM GOAL #1   Title PT to be independent with initial HEP    Time 3    Period Weeks    Status New    Target Date 11/03/20             PT Long Term Goals - 10/26/20 0835      PT LONG TERM GOAL #1   Title Pt will be independent with final HEP for neck and foot pain    Time 6    Period Weeks    Status New    Target Date 11/24/20      PT LONG TERM GOAL #2   Title Pt to be complaint with foot wear and /or orthotics as appropriate for her foot type, with good fit and comfort    Time 6    Period Weeks    Status New    Target  Date 11/24/20      PT LONG TERM GOAL #3   Title Pt to report decreased pain in neck and feet, to 0-2/10 with activity    Time 6    Period Weeks    Status New    Target Date 11/24/20      PT LONG TERM GOAL #5   Period Months                  Plan - 10/26/20 4315    Clinical Impression Statement Pt presents with primary complaint of increased pain in bil feet and neck. Pt with intermittent soreness in metatarsals. She is referred for orthotics, current are 3/4 length and very worn out. Pt with good foot position, but will benefit from updated orthotic to improve pressure relief and pain. Will continue to discuss optimal footwear for work and exercise. Pt also with soreness in cervical region, sub occipitals, likley muscle tension. SHe will benefit from postural education and strengthening, as well as manual therapy for tightness and pain. Pt to benefit from skilled PT to improve defiicts and pain.    Examination-Activity Limitations Locomotion Level;Stand    Examination-Participation Restrictions Cleaning;Occupation;Community Activity;Yard Work;Shop    Stability/Clinical Decision Making Stable/Uncomplicated    Clinical Decision Making Low    Rehab Potential Good    PT Frequency 2x / week    PT Duration 6 weeks    PT Treatment/Interventions ADLs/Self Care Home Management;Cryotherapy;Electrical Stimulation;DME Instruction;Ultrasound;Traction;Moist Heat;Iontophoresis 4mg /ml Dexamethasone;Gait training;Stair training;Functional mobility training;Therapeutic activities;Therapeutic exercise;Balance training;Orthotic Fit/Training;Patient/family education;Neuromuscular re-education;Manual techniques;Passive range of motion;Vasopneumatic Device;Dry needling;Spinal Manipulations;Joint Manipulations;Taping    Consulted and Agree with Plan of Care Patient           Patient will benefit from skilled therapeutic intervention in order to improve the following deficits and impairments:   Difficulty walking,Increased muscle spasms,Decreased activity tolerance,Pain,Impaired flexibility,Improper body mechanics,Decreased mobility,Decreased strength  Visit Diagnosis: Pain in left ankle and joints of left foot  Pain in right ankle and joints of right foot  Cervicalgia     Problem List Patient  Active Problem List   Diagnosis Date Noted  . Greater trochanteric bursitis of right hip 09/12/2018  . Right lateral epicondylitis 02/16/2018  . Iron deficiency anemia 01/09/2018  . Nonallopathic lesion of thoracic region 02/26/2016  . Nonallopathic lesion of lumbar region 02/26/2016  . Right hamstring injury 02/11/2014  . Neck pain on right side 07/13/2013  . Nonallopathic lesion of cervical region 07/13/2013  . Patellar contusion 07/13/2013  . Acromioclavicular separation, type 1 12/19/2012  . Hip pain, right 02/02/2012  . Abnormality of gait 02/02/2012    Lyndee Hensen, PT, DPT 8:57 AM  10/26/20    [facility] [place], [place], [address] Phone: [phone]   Fax:  [phone]  Name: Takeira Yanes MRN: [id number] Date of Birth: 1966-11-02

## 2020-10-29 ENCOUNTER — Encounter: Payer: Self-pay | Admitting: Physical Therapy

## 2020-10-29 ENCOUNTER — Encounter: Payer: BC Managed Care – PPO | Admitting: Physical Therapy

## 2020-11-06 ENCOUNTER — Encounter: Payer: BC Managed Care – PPO | Admitting: Physical Therapy

## 2020-11-13 NOTE — Progress Notes (Signed)
[facility] [address] [place] [address] Phone: [phone] Subjective:   I Gina [name] am serving as a Neurosurgeon for Dr. Antoine Primas.  This visit occurred during the SARS-CoV-2 public health emergency.  Safety protocols were in place, including screening questions prior to the visit, additional usage of staff PPE, and extensive cleaning of exam room while observing appropriate contact time as indicated for disinfecting solutions.   I'm seeing this patient by the request  of:  Dois Davenport, MD  CC: Low back and neck pain follow-up  [id number]  Gina [name] is a 55 y.o. female coming in with complaint of back and neck pain. OMT 09/19/2020. Patient states her neck is doing well. Lower back is painful. Wearing new orthotics.  Feels that the heel seems to almost cause her to exaggerate her back and sometimes has more of a stiffness.  No significant radiation of pain.  More of a discomfort that can the last through the night.  Has taken ibuprofen occasionally.  Medications patient has been prescribed: Valium  Taking: Intermittent         Reviewed prior external information including notes and imaging from previsou exam, outside providers and external EMR if available.   As well as notes that were available from care everywhere and other healthcare systems.  Past medical history, social, surgical and family history all reviewed in electronic medical record.  No pertanent information unless stated regarding to the chief complaint.   Past Medical History:  Diagnosis Date  . Acute meniscal tear of knee   . Allergy   . Migraine     No Known Allergies   Review of Systems:  No headache, visual changes, nausea, vomiting, diarrhea, constipation, dizziness, abdominal pain, skin rash, fevers, chills, night sweats, weight loss, swollen lymph nodes, body aches, joint swelling, chest pain, shortness of breath, mood changes. POSITIVE  muscle aches  Objective  Blood pressure (!) 150/90, height 5\' 6"  (1.676 m), weight 135 lb (61.2 kg).   General: No apparent distress alert and oriented x3 mood and affect normal, dressed appropriately.  HEENT: Pupils equal, extraocular movements intact  Respiratory: Patient's speak in full sentences and does not appear short of breath  Cardiovascular: No lower extremity edema, non tender, no erythema  Neuro: Cranial nerves II through XII are intact, neurovascularly intact in all extremities with 2+ DTRs and 2+ pulses.  Gait normal with good balance and coordination.  MSK:  Non tender with full range of motion and good stability and symmetric strength and tone of shoulders, elbows, wrist, hip, knee and ankles bilaterally.  Back -low back exam does have some mild loss of lordosis.  Tenderness to palpation of the right sacroiliac joint.  Negative straight leg test.  Mild positive Faber +5 strength in lower extremity  Osteopathic findings  C5 flexed rotated and side bent right T8 extended rotated and side bent left L2 flexed rotated and side bent right Sacrum right on right       Assessment and Plan:  Hip pain, right More tightness of the hip on the posterior aspect.  Likely some from the back.  Responding well though to manipulation.  Patient did have new orthotics and we discussed other adjustment such as metatarsal pads or a heel lift that could be beneficial.  Patient is going to see the physical therapist in the near future and maybe make adjustments where possible.  Follow-up with me again 6 to 8 weeks  Abnormality of gait  New orthotics and will have some adjustments with metatarsal pads and questionable heel lift secondary to impact in the heel.    Nonallopathic problems  Decision today to treat with OMT was based on Physical Exam  After verbal consent patient was treated with HVLA, ME, FPR techniques in cervical,  thoracic, lumbar, and sacral  areas  Patient tolerated the  procedure well with improvement in symptoms  Patient given exercises, stretches and lifestyle modifications  See medications in patient instructions if given  Patient will follow up in 4-8 weeks      The above documentation has been reviewed and is accurate and complete [name], DO       Note: This dictation was prepared with Dragon dictation along with smaller phrase technology. Any transcriptional errors that result from this process are unintentional.

## 2020-11-14 ENCOUNTER — Ambulatory Visit (INDEPENDENT_AMBULATORY_CARE_PROVIDER_SITE_OTHER): Payer: BC Managed Care – PPO | Admitting: Family Medicine

## 2020-11-14 ENCOUNTER — Encounter: Payer: Self-pay | Admitting: Family Medicine

## 2020-11-14 ENCOUNTER — Other Ambulatory Visit: Payer: Self-pay

## 2020-11-14 VITALS — BP 150/90 | Ht 66.0 in | Wt 135.0 lb

## 2020-11-14 DIAGNOSIS — R269 Unspecified abnormalities of gait and mobility: Secondary | ICD-10-CM | POA: Diagnosis not present

## 2020-11-14 DIAGNOSIS — M25551 Pain in right hip: Secondary | ICD-10-CM | POA: Diagnosis not present

## 2020-11-14 DIAGNOSIS — M999 Biomechanical lesion, unspecified: Secondary | ICD-10-CM

## 2020-11-14 NOTE — Assessment & Plan Note (Signed)
New orthotics and will have some adjustments with metatarsal pads and questionable heel lift secondary to impact in the heel.

## 2020-11-14 NOTE — Assessment & Plan Note (Signed)
More tightness of the hip on the posterior aspect.  Likely some from the back.  Responding well though to manipulation.  Patient did have new orthotics and we discussed other adjustment such as metatarsal pads or a heel lift that could be beneficial.  Patient is going to see the physical therapist in the near future and maybe make adjustments where possible.  Follow-up with me again 6 to 8 weeks

## 2020-11-14 NOTE — Patient Instructions (Signed)
Good to see you Ask about heel lift or metatarsal pad See me again in 2-3 months

## 2020-12-04 ENCOUNTER — Encounter: Payer: Self-pay | Admitting: Physical Therapy

## 2020-12-04 ENCOUNTER — Ambulatory Visit: Payer: BC Managed Care – PPO | Admitting: Physical Therapy

## 2020-12-04 ENCOUNTER — Other Ambulatory Visit: Payer: Self-pay

## 2020-12-04 DIAGNOSIS — M25572 Pain in left ankle and joints of left foot: Secondary | ICD-10-CM | POA: Diagnosis not present

## 2020-12-04 DIAGNOSIS — M25571 Pain in right ankle and joints of right foot: Secondary | ICD-10-CM

## 2020-12-04 NOTE — Therapy (Addendum)
[facility] [address] [place], [place], [address] Phone: [phone]   Fax:  [phone]  Physical Therapy Treatment/Re-cert   Patient Details  Name: Gina [name] MRN: [id number] Date of Birth: [birth year] Referring Provider (PT): Charlann Boxer   Encounter Date: 12/04/2020   PT End of Session - 12/04/20 0858    Visit Number 3    Number of Visits 12    Date for PT Re-Evaluation 01/15/21    Authorization Type [employer]    PT Start Time 0805    PT Stop Time 0842    PT Time Calculation (min) 37 min    Activity Tolerance Patient tolerated treatment well    Behavior During Therapy [facility] for tasks assessed/performed           Past Medical History:  Diagnosis Date  . Acute meniscal tear of knee   . Allergy   . Migraine     History reviewed. No pertinent surgical history.  There were no vitals filed for this visit.   Subjective Assessment - 12/04/20 0856    Subjective Pt last seen 12/19 for orthotic fitting. Pt states mild increase in back pain when trying to wear orthotics. Also would like met pad placed. Pt had to cancel appt for this previously due to being sick. Minimal foot pain.    Currently in Pain? No/denies                             [facility] Adult PT Treatment/Exercise - 12/04/20 0001      Self-Care   Self-Care Other Self-Care Comments    Other Self-Care Comments  L orthotic re-molded for arch comfort,  Bil met pads placed, good fit and comfort today.      Exercises   Exercises Ankle      Ankle Exercises: Stretches   Soleus Stretch 3 reps;30 seconds   reviewed for HEP   Gastroc Stretch 3 reps;30 seconds   reviewed for HEP   Other Stretch SKTC, DKTC, 30 sec x 2 each;  Pelvic tilts x 15;                  PT Education - 12/04/20 0858    Education Details Education on metatarsal pad, footwear recommendation, Low back mobility,    Person(s) Educated Patient    Methods  Explanation;Demonstration;Verbal cues    Comprehension Verbalized understanding;Returned demonstration;Verbal cues required            PT Short Term Goals - 12/04/20 0904      PT SHORT TERM GOAL #1   Title PT to be independent with initial HEP    Time 3    Period Weeks    Status Achieved    Target Date 11/03/20             PT Long Term Goals - 12/04/20 0904      PT LONG TERM GOAL #1   Title Pt will be independent with final HEP for neck and foot pain    Time 6    Period Weeks    Status Partially Met      PT LONG TERM GOAL #2   Title Pt to be complaint with foot wear and /or orthotics as appropriate for her foot type, with good fit and comfort    Time 6    Period Weeks    Status Partially Met      PT LONG TERM GOAL #3  Title Pt to report decreased pain in neck and feet, to 0-2/10 with activity    Time 6    Period Weeks    Status Partially Met                 Plan - 12/04/20 0907    Clinical Impression Statement L orthotic re-molded for improved arch comfort, and metatarsal pads placed bilaterally for metatarsal relief and pain. Pt seems to be getting slight over-correction/supination in current shoe with orthotics, which may be causing increased back pain as well. Discussed footwear update/recommendation for neutral shoe to improve. Pt will return as needed for foot pain. Discussed stretching for LBP. Re-cert today, as pt may need additional time frame for improving foot pain/ orthotic management. Pt seen only 2 times in previous plan of care dates.    Examination-Activity Limitations Locomotion Level;Stand    Examination-Participation Restrictions Cleaning;Occupation;Community Activity;Yard Work;Shop    Stability/Clinical Decision Making Stable/Uncomplicated    Rehab Potential Good    PT Frequency 2x / week    PT Duration 6 weeks    PT Treatment/Interventions ADLs/Self Care Home Management;Cryotherapy;Electrical Stimulation;DME  Instruction;Ultrasound;Traction;Moist Heat;Iontophoresis 79m/ml Dexamethasone;Gait training;Stair training;Functional mobility training;Therapeutic activities;Therapeutic exercise;Balance training;Orthotic Fit/Training;Patient/family education;Neuromuscular re-education;Manual techniques;Passive range of motion;Vasopneumatic Device;Dry needling;Spinal Manipulations;Joint Manipulations;Taping    Consulted and Agree with Plan of Care Patient           Patient will benefit from skilled therapeutic intervention in order to improve the following deficits and impairments:  Difficulty walking,Increased muscle spasms,Decreased activity tolerance,Pain,Impaired flexibility,Improper body mechanics,Decreased mobility,Decreased strength  Visit Diagnosis: Pain in left ankle and joints of left foot  Pain in right ankle and joints of right foot     Problem List Patient Active Problem List   Diagnosis Date Noted  . Greater trochanteric bursitis of right hip 09/12/2018  . Right lateral epicondylitis 02/16/2018  . Iron deficiency anemia 01/09/2018  . Nonallopathic lesion of thoracic region 02/26/2016  . Nonallopathic lesion of lumbar region 02/26/2016  . Right hamstring injury 02/11/2014  . Neck pain on right side 07/13/2013  . Nonallopathic lesion of cervical region 07/13/2013  . Patellar contusion 07/13/2013  . Acromioclavicular separation, type 1 12/19/2012  . Hip pain, right 02/02/2012  . Abnormality of gait 02/02/2012    LLyndee Hensen PT, DPT 9:13 AM  12/04/20    [facility] [address]Phone: [phone]  Fax:  [phone] Name: Gina SkorupskiMRN: [id number] of Birth: [birth year]  PHYSICAL THERAPY DISCHARGE SUMMARY  Visits from Start of Care:  3 Plan: Patient agrees to discharge.  Patient goals were met. Patient is being discharged due to not returning since the last visit.  ?????      LLyndee Hensen PT, DPT 10:54 AM  03/06/21

## 2021-01-08 NOTE — Progress Notes (Signed)
[facility] [place] Phone: [phone] Subjective:   Gina Gina [name], am serving as a scribe for Dr. Hulan [name]. This visit occurred during the SARS-CoV-2 public health emergency.  Safety protocols were in place, including screening questions prior to the visit, additional usage of staff PPE, and extensive cleaning of exam room while observing appropriate contact time as indicated for disinfecting solutions.   I'm seeing this patient by the request  of:  Gina Rasmussen, MD  CC: Neck and back pain follow-up  [id number]  Gina Gina [name] is a 55 y.o. female coming in with complaint of back and neck pain. OMT 11/14/2020. Patient states that she is doing well. Right hip gives her some trouble when she is walking. Pan over lateral aspect that radiates into the groin.  Patient states that this is very intermittent overall.  Nothing that stopping her from activity.  Not using any of the medications on a regular basis  Medications patient has been prescribed: Advil  Taking: Very seldomly         Reviewed prior external information including notes and imaging from previsou exam, outside providers and external EMR if available.   As well as notes that were available from care everywhere and other healthcare systems.  Past medical history, social, surgical and family history all reviewed in electronic medical record.  Gina [name] unless stated regarding to the chief complaint.   Past Medical History:  Diagnosis Date  . Acute meniscal tear of knee   . Allergy   . Migraine     Gina [name] Known Allergies   Review of Systems:  Gina [name] headache, visual changes, nausea, vomiting, diarrhea, constipation, dizziness, abdominal pain, skin rash, fevers, chills, night sweats, weight loss, swollen lymph nodes, body aches, joint swelling, chest pain, shortness of breath, mood changes. POSITIVE muscle aches but mild  Objective   Blood pressure 110/80, pulse 63, height 5\' 6"  (1.676 m), SpO2 99 %.   General: Gina [name] apparent distress alert and oriented x3 mood and affect normal, dressed appropriately.  HEENT: Pupils equal, extraocular movements intact  Respiratory: Patient's speak in full sentences and does not appear short of breath  Cardiovascular: Gina [name] lower extremity edema, non tender, Gina [name] erythema  Gait normal with good balance and coordination.  MSK:  Non tender with full range of motion and good stability and symmetric strength and tone of shoulders, elbows, wrist, hip, knee and ankles bilaterally.  Back -continue mild tightness mostly in the thoracolumbar juncture as well as over the right sacroiliac joint.  Negative straight leg test.  Five of the lower extremities. Neck exam does have some tightness noted on the left side of the neck.  Osteopathic findings  C6 flexed rotated and side bent left T9 extended rotated and side bent left  Sacrum right on right       Assessment and Plan:  Hip pain, right Continued tightness. More hip flexor and posterior  Discussed with patient about home exercise and icing regimen. Continue to use the orthotics. Given patient metatarsal pads and other shoes that may help as well. Patient was to see a physical therapist and will consider it. Given muscle relaxer for bedtime use. Patient does have Valium but has not used it much and would like to decrease if possible as well. Follow-up with me again 6 to 8 weeks.    Nonallopathic problems  Decision today to treat with OMT was based on Physical Exam  After verbal consent  patient was treated with HVLA, ME, FPR techniques in cervical,  thoracic, lumbar, and sacral  areas  Patient tolerated the procedure well with improvement in symptoms  Patient given exercises, stretches and lifestyle modifications  See medications in patient instructions if given  Patient will follow up in 4-8 weeks      The above documentation has been  reviewed and is accurate and complete Gina Pulley, DO       Note: This dictation was prepared with Dragon dictation along with smaller phrase technology. Any transcriptional errors that result from this process are unintentional.

## 2021-01-09 ENCOUNTER — Ambulatory Visit: Payer: BC Managed Care – PPO | Admitting: Family Medicine

## 2021-01-09 ENCOUNTER — Encounter: Payer: Self-pay | Admitting: Family Medicine

## 2021-01-09 ENCOUNTER — Other Ambulatory Visit: Payer: Self-pay

## 2021-01-09 VITALS — BP 110/80 | HR 63 | Ht 66.0 in

## 2021-01-09 DIAGNOSIS — M25551 Pain in right hip: Secondary | ICD-10-CM | POA: Diagnosis not present

## 2021-01-09 DIAGNOSIS — M999 Biomechanical lesion, unspecified: Secondary | ICD-10-CM

## 2021-01-09 MED ORDER — CYCLOBENZAPRINE HCL 5 MG PO TABS: 5.0000 mg | ORAL_TABLET | Freq: Every evening | ORAL | 0 refills | Status: DC | PRN

## 2021-01-09 NOTE — Assessment & Plan Note (Signed)
Continued tightness. More hip flexor and posterior  Discussed with patient about home exercise and icing regimen. Continue to use the orthotics. Given patient metatarsal pads and other shoes that may help as well. Patient was to see a physical therapist and will consider it. Given muscle relaxer for bedtime use. Patient does have Valium but has not used it much and would like to decrease if possible as well. Follow-up with me again 6 to 8 weeks.

## 2021-01-09 NOTE — Patient Instructions (Signed)
Flexeril called in Stay active  Use adjustable standing desk as work See me in 8 weeks

## 2021-03-05 NOTE — Progress Notes (Signed)
[facility] [address] [place] Phone: [phone] Subjective:   I Gina [name] am serving as a Education administrator for Dr. Hulan Saas.  This visit occurred during the SARS-CoV-2 public health emergency.  Safety protocols were in place, including screening questions prior to the visit, additional usage of staff PPE, and extensive cleaning of exam room while observing appropriate contact time as indicated for disinfecting solutions.   I'm seeing this patient by the request  of:  Hayden Rasmussen, MD  CC: Back and neck pain follow-up  [id number]  Gina [name] is a 55 y.o. female coming in with complaint of back and neck pain. OMT 01/09/2021. Patient states she feels decent. States she has bilateral lower back pain. Left shoulder pain at the [facility] joint. Patient would like injection.   Medications patient has been prescribed: Flexeril          Reviewed prior external information including notes and imaging from previsou exam, outside providers and external EMR if available.   As well as notes that were available from care everywhere and other healthcare systems.  Past medical history, social, surgical and family history all reviewed in electronic medical record.  No pertanent information unless stated regarding to the chief complaint.   Past Medical History:  Diagnosis Date  . Acute meniscal tear of knee   . Allergy   . Migraine     No Known Allergies   Review of Systems:  No headache, visual changes, nausea, vomiting, diarrhea, constipation, dizziness, abdominal pain, skin rash, fevers, chills, night sweats, weight loss, swollen lymph nodes, body aches, joint swelling, chest pain, shortness of breath, mood changes. POSITIVE muscle aches  Objective  Blood pressure 100/80, pulse 61, height 5\' 6"  (1.676 m), weight 137 lb (62.1 kg), SpO2 100 %.   General: No apparent distress alert and oriented x3 mood and affect normal, dressed  appropriately.  HEENT: Pupils equal, extraocular movements intact  Respiratory: Patient's speak in full sentences and does not appear short of breath  Cardiovascular: No lower extremity edema, non tender, no erythema  Gait normal with good balance and coordination.  Left shoulder, positive crossover noted.  Tender to palpation in the acromioclavicular joint.  Patient's rotator cuff strength though is intact. Back -tightness in the neck noted.  Right greater than left.  Patient has very mild crepitus.  Negative Spurling's.  Tightness in the parascapular region of the right shoulder. Low back did have more tightness noted in the sacroiliac joint right greater than left.  Osteopathic findings C5 RS right  T3 extended rotated and side bent right inhaled rib T9 extended rotated and side bent left L2 flexed rotated and side bent right Sacrum right on right   After verbal consent patient was prepped with alcohol swab and with a 25-gauge half inch needle injected into the left acromioclavicular joint from a superior angle with a total of 0.5 cc of 0.5% Marcaine and 0.5 cc of Kenalog 40 mg/mL no blood loss.  Band-Aid placed.  Postinjection instructions given    Assessment and Plan:  Acromioclavicular separation, type 1 Repeat injection given again today.  Tolerated the procedure well.  Known arthritic changes and does have swelling noted today.  Patient has responded well before and hopefully this will continue to help.  Follow-up with me again in 6 to 8 weeks    Nonallopathic problems  Decision today to treat with OMT was based on Physical Exam  After verbal consent patient was treated  with HVLA, ME, FPR techniques in cervical, rib, thoracic, lumbar, sacral areas  Patient tolerated the procedure well with improvement in symptoms  Patient given exercises, stretches and lifestyle modifications  See medications in patient instructions if given  Patient will follow up in 4-8 weeks       The above documentation has been reviewed and is accurate and complete [name], DO       Note: This dictation was prepared with Dragon dictation along with smaller phrase technology. Any transcriptional errors that result from this process are unintentional.

## 2021-03-10 ENCOUNTER — Other Ambulatory Visit: Payer: Self-pay

## 2021-03-10 ENCOUNTER — Ambulatory Visit: Payer: BC Managed Care – PPO | Admitting: Family Medicine

## 2021-03-10 ENCOUNTER — Encounter: Payer: Self-pay | Admitting: Family Medicine

## 2021-03-10 VITALS — BP 100/80 | HR 61 | Ht 66.0 in | Wt 137.0 lb

## 2021-03-10 DIAGNOSIS — M9902 Segmental and somatic dysfunction of thoracic region: Secondary | ICD-10-CM

## 2021-03-10 DIAGNOSIS — S43102D Unspecified dislocation of left acromioclavicular joint, subsequent encounter: Secondary | ICD-10-CM

## 2021-03-10 DIAGNOSIS — M9904 Segmental and somatic dysfunction of sacral region: Secondary | ICD-10-CM

## 2021-03-10 DIAGNOSIS — M9903 Segmental and somatic dysfunction of lumbar region: Secondary | ICD-10-CM

## 2021-03-10 DIAGNOSIS — M9908 Segmental and somatic dysfunction of rib cage: Secondary | ICD-10-CM | POA: Diagnosis not present

## 2021-03-10 DIAGNOSIS — M9901 Segmental and somatic dysfunction of cervical region: Secondary | ICD-10-CM | POA: Diagnosis not present

## 2021-03-10 DIAGNOSIS — M542 Cervicalgia: Secondary | ICD-10-CM

## 2021-03-10 NOTE — Assessment & Plan Note (Signed)
Repeat injection given again today.  Tolerated the procedure well.  Known arthritic changes and does have swelling noted today.  Patient has responded well before and hopefully this will continue to help.  Follow-up with me again in 6 to 8 weeks

## 2021-03-10 NOTE — Patient Instructions (Addendum)
Good to see you Injected the [facility] joint today Stretch after activity  See me again in 6-8 weeks

## 2021-03-10 NOTE — Assessment & Plan Note (Signed)
Tightness noted.  Discussed icing regimen and home exercises.  Discussed which activities to do which wants to avoid.  Responding well to manipulation.  Does have Flexeril noted.  Follow-up again in 4 to 8 weeks

## 2021-05-04 NOTE — Progress Notes (Signed)
[facility] [address] [place] Phone: [phone] Subjective:   I Gina [name] am serving as a Education administrator for Dr. Hulan Saas.  This visit occurred during the SARS-CoV-2 public health emergency.  Safety protocols were in place, including screening questions prior to the visit, additional usage of staff PPE, and extensive cleaning of exam room while observing appropriate contact time as indicated for disinfecting solutions.   I'm seeing this patient by the request  of:  Hayden Rasmussen, MD  CC: Neck and back pain follow-up  [id number]  Caterin Tabares is a 55 y.o. female coming in with complaint of back and neck pain. OMT 03/10/2021. Patient states she is doing well making improvements. Having issues with right metatarsal. States it is inflamed from being on it all day.  Patient states that it does not stop her from activity but does give her some discomfort and pain from time to time.  Medications patient has been prescribed: Flexeril          Reviewed prior external information including notes and imaging from previsou exam, outside providers and external EMR if available.   As well as notes that were available from care everywhere and other healthcare systems.  Past medical history, social, surgical and family history all reviewed in electronic medical record.  No pertanent information unless stated regarding to the chief complaint.   Past Medical History:  Diagnosis Date   Acute meniscal tear of knee    Allergy    Migraine     No Known Allergies   Review of Systems:  No headache, visual changes, nausea, vomiting, diarrhea, constipation, dizziness, abdominal pain, skin rash, fevers, chills, night sweats, weight loss, swollen lymph nodes, body aches, joint swelling, chest pain, shortness of breath, mood changes. POSITIVE muscle aches  Objective  Blood pressure 132/82, pulse 68, height 5\' 6"  (1.676 m), weight 136 lb  (61.7 kg), SpO2 92 %.   General: No apparent distress alert and oriented x3 mood and affect normal, dressed appropriately.  HEENT: Pupils equal, extraocular movements intact  Respiratory: Patient's speak in full sentences and does not appear short of breath  Cardiovascular: No lower extremity edema, non tender, no erythema  Neuro: Cranial nerves II through XII are intact, neurovascularly intact in all extremities with 2+ DTRs and 2+ pulses.  Gait normal with good balance and coordination.  MSK:  Non tender with full range of motion and good stability and symmetric strength and tone of shoulders, elbows, wrist, hip, knee and ankles bilaterally.  Back - Normal skin, Spine with normal alignment and no deformity.  No tenderness to vertebral process palpation.  Paraspinous muscles are not tender and without spasm.   Range of motion is full at neck and lumbar sacral regions Foot examination the patient does have loss of transverse arch noted.  Patient is tender to palpation on the forefoot on the plantar aspect of the metatarsal heads.  No significant swelling or bruising are noted.  No callus formation noted.  Mild rigid midfoot.  Osteopathic findings  C6 flexed rotated and side bent right T3 extended rotated and side bent right inhaled rib L2 flexed rotated and side bent right Sacrum right on right       Assessment and Plan: Neck pain on right side Chronic, with mild exacerbation.  Discussed with patient icing regimen and home exercises.  Increase activity slowly.  We discussed home exercise.  Continue to work on Engineer, building services.  Follow-up with  me again in 6 to 8 weeks  Metatarsalgia Patient does have foot pain bilaterally.  Has been walking barefoot.  Likely contributing.  Discussed different shoes in the house.  Follow-up again with me in 6 to 8 weeks otherwise.    Nonallopathic problems  Decision today to treat with OMT was based on Physical Exam  After verbal consent  patient was treated with HVLA, ME, FPR techniques in cervical, rib, thoracic, lumbar, and sacral  areas  Patient tolerated the procedure well with improvement in symptoms  Patient given exercises, stretches and lifestyle modifications  See medications in patient instructions if given  Patient will follow up in 4-8 weeks      The above documentation has been reviewed and is accurate and complete [name], DO        Note: This dictation was prepared with Dragon dictation along with smaller phrase technology. Any transcriptional errors that result from this process are unintentional.

## 2021-05-05 ENCOUNTER — Other Ambulatory Visit: Payer: Self-pay

## 2021-05-05 ENCOUNTER — Ambulatory Visit: Payer: BC Managed Care – PPO | Admitting: Family Medicine

## 2021-05-05 ENCOUNTER — Encounter: Payer: Self-pay | Admitting: Family Medicine

## 2021-05-05 VITALS — BP 132/82 | HR 68 | Ht 66.0 in | Wt 136.0 lb

## 2021-05-05 DIAGNOSIS — M9908 Segmental and somatic dysfunction of rib cage: Secondary | ICD-10-CM | POA: Diagnosis not present

## 2021-05-05 DIAGNOSIS — M7740 Metatarsalgia, unspecified foot: Secondary | ICD-10-CM | POA: Insufficient documentation

## 2021-05-05 DIAGNOSIS — M9904 Segmental and somatic dysfunction of sacral region: Secondary | ICD-10-CM

## 2021-05-05 DIAGNOSIS — M542 Cervicalgia: Secondary | ICD-10-CM | POA: Diagnosis not present

## 2021-05-05 DIAGNOSIS — M7742 Metatarsalgia, left foot: Secondary | ICD-10-CM

## 2021-05-05 DIAGNOSIS — M9903 Segmental and somatic dysfunction of lumbar region: Secondary | ICD-10-CM | POA: Diagnosis not present

## 2021-05-05 DIAGNOSIS — M7741 Metatarsalgia, right foot: Secondary | ICD-10-CM

## 2021-05-05 DIAGNOSIS — M9901 Segmental and somatic dysfunction of cervical region: Secondary | ICD-10-CM | POA: Diagnosis not present

## 2021-05-05 DIAGNOSIS — M9902 Segmental and somatic dysfunction of thoracic region: Secondary | ICD-10-CM | POA: Diagnosis not present

## 2021-05-05 NOTE — Patient Instructions (Addendum)
Good to see you [place] you [place] it [place] or oofos recovery sandals See me again in 6-8

## 2021-05-05 NOTE — Assessment & Plan Note (Signed)
Chronic, with mild exacerbation.  Discussed with patient icing regimen and home exercises.  Increase activity slowly.  We discussed home exercise.  Continue to work on Engineer, building services.  Follow-up with me again in 6 to 8 weeks

## 2021-05-05 NOTE — Assessment & Plan Note (Signed)
Patient does have foot pain bilaterally.  Has been walking barefoot.  Likely contributing.  Discussed different shoes in the house.  Follow-up again with me in 6 to 8 weeks otherwise.

## 2021-05-26 NOTE — Progress Notes (Signed)
[facility] [address] [place] Phone: [phone] Subjective:   I Gina [name] am serving as a Education administrator for Dr. Hulan Saas.  This visit occurred during the SARS-CoV-2 public health emergency.  Safety protocols were in place, including screening questions prior to the visit, additional usage of staff PPE, and extensive cleaning of exam room while observing appropriate contact time as indicated for disinfecting solutions.   I'm seeing this patient by the request  of:  Hayden Rasmussen, MD  CC: Back and neck pain follow-up  NOM:[id number]  Gina [name] is a 55 y.o. female coming in with complaint of back and neck pain. OMT 05/05/2021. Patient states she has pain her hamstring is painful and that she map PT/ dry needling to help the hamstring.  Patient states some mild tightness noted.  Does not remember any true injury.  Denies any weakness.  Has not been quite as active as she would like with her being more busy at work.  Medications patient has been prescribed: Flexeril           Reviewed prior external information including notes and imaging from previsou exam, outside providers and external EMR if available.   As well as notes that were available from care everywhere and other healthcare systems.  Past medical history, social, surgical and family history all reviewed in electronic medical record.  No pertanent information unless stated regarding to the chief complaint.   Past Medical History:  Diagnosis Date   Acute meniscal tear of knee    Allergy    Migraine     No Known Allergies   Review of Systems:  No headache, visual changes, nausea, vomiting, diarrhea, constipation, dizziness, abdominal pain, skin rash, fevers, chills, night sweats, weight loss, swollen lymph nodes, body aches, joint swelling, chest pain, shortness of breath, mood changes. POSITIVE muscle aches  Objective  Blood pressure 120/72, pulse 72,  height 5\' 6"  (1.676 m), weight 135 lb (61.2 kg), SpO2 100 %.   General: No apparent distress alert and oriented x3 mood and affect normal, dressed appropriately.  HEENT: Pupils equal, extraocular movements intact  Respiratory: Patient's speak in full sentences and does not appear short of breath  Cardiovascular: No lower extremity edema, non tender, no erythema  Low back exam does show some mild loss of lordosis.  Patient does have tightness of the right hamstring.  Mild pain with resisted flexion with of the knee and the hamstring negative straight leg test noted.  Deep tendon reflexes intact.  Tightness noted in the paraspinal musculature of the lumbar spine, as well as in the thoracic and the neck with sidebending.  Osteopathic findings  C2 flexed rotated and side bent right C6 flexed rotated and side bent left T3 extended rotated and side bent right inhaled rib T9 extended rotated and side bent left L2 flexed rotated and side bent right Sacrum right on right       Assessment and Plan:  Right hamstring injury Patient has had an injury to the hamstring previously and seems to be having more of a recurrence.  Discussed with patient about icing regimen and home exercise, thigh compression sleeve, heel lifts given.  Work with Product/process development scientist to learn home exercises greater.  Follow-up with me again in 6 to 8 weeks  Abnormality of gait Patient has noticed tightness that seems to be intermittent.  Discussed symptom monitoring which orthotics she is wearing and see if that is exacerbating it and we  can make adjustments as needed.  Neck pain on right side Chronic, with mild exacerbation.  Responds well to osteopathic manipulation.  Increase activity slowly.  Follow-up again in 6 to 8 weeks   Nonallopathic problems  Decision today to treat with OMT was based on Physical Exam  After verbal consent patient was treated with HVLA, ME, FPR techniques in cervical, rib, thoracic, lumbar, and  sacral  areas  Patient tolerated the procedure well with improvement in symptoms  Patient given exercises, stretches and lifestyle modifications  See medications in patient instructions if given  Patient will follow up in 4-8 weeks     The above documentation has been reviewed and is accurate and complete [name], DO        Note: This dictation was prepared with Dragon dictation along with smaller phrase technology. Any transcriptional errors that result from this process are unintentional.

## 2021-05-27 ENCOUNTER — Other Ambulatory Visit: Payer: Self-pay

## 2021-05-27 ENCOUNTER — Encounter: Payer: Self-pay | Admitting: Family Medicine

## 2021-05-27 ENCOUNTER — Ambulatory Visit: Payer: BC Managed Care – PPO | Admitting: Family Medicine

## 2021-05-27 VITALS — BP 120/72 | HR 72 | Ht 66.0 in | Wt 135.0 lb

## 2021-05-27 DIAGNOSIS — M9903 Segmental and somatic dysfunction of lumbar region: Secondary | ICD-10-CM

## 2021-05-27 DIAGNOSIS — S76301S Unspecified injury of muscle, fascia and tendon of the posterior muscle group at thigh level, right thigh, sequela: Secondary | ICD-10-CM

## 2021-05-27 DIAGNOSIS — M9901 Segmental and somatic dysfunction of cervical region: Secondary | ICD-10-CM | POA: Diagnosis not present

## 2021-05-27 DIAGNOSIS — M9908 Segmental and somatic dysfunction of rib cage: Secondary | ICD-10-CM

## 2021-05-27 DIAGNOSIS — R269 Unspecified abnormalities of gait and mobility: Secondary | ICD-10-CM | POA: Diagnosis not present

## 2021-05-27 DIAGNOSIS — M542 Cervicalgia: Secondary | ICD-10-CM

## 2021-05-27 DIAGNOSIS — M9904 Segmental and somatic dysfunction of sacral region: Secondary | ICD-10-CM

## 2021-05-27 DIAGNOSIS — M9902 Segmental and somatic dysfunction of thoracic region: Secondary | ICD-10-CM

## 2021-05-27 MED ORDER — GABAPENTIN 100 MG PO CAPS: 200.0000 mg | ORAL_CAPSULE | Freq: Every day | ORAL | 3 refills | Status: DC

## 2021-05-27 NOTE — Patient Instructions (Signed)
Good to see you Gabapentin 200 mg at night when needed Thigh compression sleeve with working out or a lot of activity Try the heel lift in shoes See me again in 5-6 weeks

## 2021-05-28 ENCOUNTER — Encounter: Payer: Self-pay | Admitting: Family Medicine

## 2021-05-28 NOTE — Assessment & Plan Note (Signed)
Patient has had an injury to the hamstring previously and seems to be having more of a recurrence.  Discussed with patient about icing regimen and home exercise, thigh compression sleeve, heel lifts given.  Work with Product/process development scientist to learn home exercises greater.  Follow-up with me again in 6 to 8 weeks

## 2021-05-28 NOTE — Assessment & Plan Note (Signed)
Patient has noticed tightness that seems to be intermittent.  Discussed symptom monitoring which orthotics she is wearing and see if that is exacerbating it and we can make adjustments as needed.

## 2021-05-28 NOTE — Assessment & Plan Note (Signed)
Chronic, with mild exacerbation.  Responds well to osteopathic manipulation.  Increase activity slowly.  Follow-up again in 6 to 8 weeks

## 2021-07-09 ENCOUNTER — Ambulatory Visit: Payer: BC Managed Care – PPO | Admitting: Family Medicine

## 2021-07-14 NOTE — Progress Notes (Signed)
[name] Phone: [phone] Subjective:   [name], am serving as a scribe for Dr. Hulan Saas.  This visit occurred during the SARS-CoV-2 public health emergency.  Safety protocols were in place, including screening questions prior to the visit, additional usage of staff PPE, and extensive cleaning of exam room while observing appropriate contact time as indicated for disinfecting solutions.    I'm seeing this patient by the request  of:  Hayden Rasmussen, MD  CC: Neck pain, shoulder pain, hamstring pain  [id number]  Gina [name] is a 55 y.o. female coming in with complaint of back and neck pain. OMT on 05/27/2021.  Also had an hamstring injury. Patient has had an injury to the hamstring previously and seems to be having more of a recurrence.  Discussed with patient about icing regimen and home exercise, thigh compression sleeve, heel lifts given.  Work with Product/process development scientist to learn home exercises greater.  Follow-up with me again in 6 to 8 weeks. Patient states that she is having tightness in glute and hamstring on right lumbar spine.  In the process of switching jobs that likely will contribute to some stress as well.  Medications patient has been prescribed: Gabapentin  Taking: Intermittently         Past Medical History:  Diagnosis Date   Acute meniscal tear of knee    Allergy    Migraine     No Known Allergies   Review of Systems:  No headache, visual changes, nausea, vomiting, diarrhea, constipation, dizziness, abdominal pain, skin rash, fevers, chills, night sweats, weight loss, swollen lymph nodes, body aches, joint swelling, chest pain, shortness of breath, mood changes. POSITIVE muscle aches  Objective  Blood pressure 132/80, pulse (!) 48, height '5\' 6"'$  (1.676 m), SpO2 99 %.   General: No apparent distress alert and oriented x3 mood and affect normal, dressed appropriately.   HEENT: Pupils equal, extraocular movements intact  Respiratory: Patient's speak in full sentences and does not appear short of breath  Cardiovascular: No lower extremity edema, non tender, no erythema  Low back exam does have some mild loss of lordosis.  Tightness noted on the right side of the paraspinal musculature.  Patient does have tightness of the hamstring.  Still some pain in the belly of the hamstring.  Good range of motion of the hip noted.  Patient does have hypertrophy of the [facility] joint on the left side noted.  Neck exam does have tightness left greater than right.  Mild decrease in left-sided sidebending.  Osteopathic findings  C6 flexed rotated and side bent left T3 extended rotated and side bent right inhaled rib T7 extended rotated and side bent left L2 flexed rotated and side bent right Sacrum right on right       Assessment and Plan:  Neck pain on right side Patient has had pain in the neck does respond well to manipulation.  Patient is in the process of finding a new job.  Patient is hoping that she will not be at a desk significant amount and maybe help with some of his discomfort.  Discussed with patient that we will continue to monitor.  Follow-up again in 6 to 8 weeks.  Acromioclavicular separation, type 1 Patient does have some arthritic changes noted.  There is noted some hypertrophy noted.  Patient at this point wants to continue with conservative therapy.  We will continue to monitor.  Discussed potential x-rays  and we will discuss in about follow-up.  Right hamstring injury Continues to intermittently have the right hamstring injury.  Has responded well to dry needling previously and we will send patient to formal physical therapy.  Discussed icing regimen and home exercises.  Worsening symptoms will need to consider the possibility of advanced imaging but at the moment we will hold.  If we do advanced imaging secondary to the longevity of this pain  intermittently from 2015 I would recommend MRI of the lumbar and pelvis.   Nonallopathic problems  Decision today to treat with OMT was based on Physical Exam  After verbal consent patient was treated with HVLA, ME, FPR techniques in cervical, rib, thoracic, lumbar, and sacral  areas  Patient tolerated the procedure well with improvement in symptoms  Patient given exercises, stretches and lifestyle modifications  See medications in patient instructions if given  Patient will follow up in 4-8 weeks     The above documentation has been reviewed and is accurate and complete [name], DO        Note: This dictation was prepared with Dragon dictation along with smaller phrase technology. Any transcriptional errors that result from this process are unintentional.

## 2021-07-15 ENCOUNTER — Encounter: Payer: Self-pay | Admitting: Family Medicine

## 2021-07-15 ENCOUNTER — Other Ambulatory Visit: Payer: Self-pay

## 2021-07-15 ENCOUNTER — Ambulatory Visit: Payer: BC Managed Care – PPO | Admitting: Family Medicine

## 2021-07-15 ENCOUNTER — Telehealth: Payer: Self-pay

## 2021-07-15 VITALS — BP 132/80 | HR 48 | Ht 66.0 in

## 2021-07-15 DIAGNOSIS — S43102D Unspecified dislocation of left acromioclavicular joint, subsequent encounter: Secondary | ICD-10-CM

## 2021-07-15 DIAGNOSIS — M9908 Segmental and somatic dysfunction of rib cage: Secondary | ICD-10-CM | POA: Diagnosis not present

## 2021-07-15 DIAGNOSIS — M9902 Segmental and somatic dysfunction of thoracic region: Secondary | ICD-10-CM

## 2021-07-15 DIAGNOSIS — M9901 Segmental and somatic dysfunction of cervical region: Secondary | ICD-10-CM

## 2021-07-15 DIAGNOSIS — S76301S Unspecified injury of muscle, fascia and tendon of the posterior muscle group at thigh level, right thigh, sequela: Secondary | ICD-10-CM

## 2021-07-15 DIAGNOSIS — M542 Cervicalgia: Secondary | ICD-10-CM

## 2021-07-15 DIAGNOSIS — M9904 Segmental and somatic dysfunction of sacral region: Secondary | ICD-10-CM | POA: Diagnosis not present

## 2021-07-15 DIAGNOSIS — M9903 Segmental and somatic dysfunction of lumbar region: Secondary | ICD-10-CM

## 2021-07-15 NOTE — Assessment & Plan Note (Signed)
Patient does have some arthritic changes noted.  There is noted some hypertrophy noted.  Patient at this point wants to continue with conservative therapy.  We will continue to monitor.  Discussed potential x-rays and we will discuss in about follow-up.

## 2021-07-15 NOTE — Patient Instructions (Signed)
PT at [facility] See me again in 6-8 weeks

## 2021-07-15 NOTE — Assessment & Plan Note (Signed)
Patient has had pain in the neck does respond well to manipulation.  Patient is in the process of finding a new job.  Patient is hoping that she will not be at a desk significant amount and maybe help with some of his discomfort.  Discussed with patient that we will continue to monitor.  Follow-up again in 6 to 8 weeks.

## 2021-07-15 NOTE — Assessment & Plan Note (Signed)
Continues to intermittently have the right hamstring injury.  Has responded well to dry needling previously and we will send patient to formal physical therapy.  Discussed icing regimen and home exercises.  Worsening symptoms will need to consider the possibility of advanced imaging but at the moment we will hold.  If we do advanced imaging secondary to the longevity of this pain intermittently from 2015 I would recommend MRI of the lumbar and pelvis.

## 2021-07-15 NOTE — Telephone Encounter (Signed)
Patient has appt for 9/12.  She states she will need dry needling completed.  Would like a call back from [place] to see if this will be completed at the first eval.

## 2021-07-20 ENCOUNTER — Ambulatory Visit: Payer: BC Managed Care – PPO | Admitting: Physical Therapy

## 2021-07-20 ENCOUNTER — Encounter: Payer: Self-pay | Admitting: Physical Therapy

## 2021-07-20 ENCOUNTER — Other Ambulatory Visit: Payer: Self-pay

## 2021-07-20 DIAGNOSIS — M545 Low back pain, unspecified: Secondary | ICD-10-CM

## 2021-07-20 DIAGNOSIS — M79604 Pain in right leg: Secondary | ICD-10-CM

## 2021-07-20 DIAGNOSIS — G8929 Other chronic pain: Secondary | ICD-10-CM

## 2021-07-20 NOTE — Patient Instructions (Signed)
Access Code: [email or web address] URL: [email or web address] Date: 07/20/2021 Prepared by: Lyndee Hensen  Exercises Supine Hamstring Stretch with Strap - 2 x daily - 3 reps - 30 hold Seated Hamstring Stretch - 2 x daily - 3 reps - 30 hold Supine Figure 4 Piriformis Stretch - 2 x daily - 3 reps - 30 hold Child's Pose with Sidebending - 2 x daily - 3 reps - 30 hold

## 2021-07-20 NOTE — Therapy (Signed)
[facility] [address] [place], [place], [address] Phone: [phone]   Fax:  [phone]  Physical Therapy Evaluation  Patient Details  Name: Gina [name] MRN: [id number] Date of Birth: Mar 12, 1966 Referring Provider (PT): Charlann Boxer   Encounter Date: 07/20/2021   PT End of Session - 07/20/21 1303     Visit Number 1    Number of Visits 12    Date for PT Re-Evaluation 08/31/21    Authorization Type BCBS    PT Start Time 0803    PT Stop Time 0846    PT Time Calculation (min) 43 min    Activity Tolerance Patient tolerated treatment well    Behavior During Therapy [facility] for tasks assessed/performed             Past Medical History:  Diagnosis Date   Acute meniscal tear of knee    Allergy    Migraine     History reviewed. No pertinent surgical history.  There were no vitals filed for this visit.    Subjective Assessment - 07/20/21 0805     Subjective Pt states ongoing back and leg pain. Most pain in R posterior thigh pain and R glute . She has had hamstring stain in the past. Feels R side is much tighter than L. She has been somewhat less active lately, but would like to get back into walking more. She is Not currently working, in between jobs. She states no tingling/nerve pain in LE. She may have change of insurance at end of this month.    Limitations Walking;Standing;House hold activities;Lifting    Patient Stated Goals decreaed pain in R LE.    Currently in Pain? Yes    Pain Score 3     Pain Location Leg    Pain Orientation Right    Pain Descriptors / Indicators Aching;Tightness    Pain Type Chronic pain;Acute pain    Pain Onset More than a month ago    Pain Frequency Intermittent    Aggravating Factors  stretching HS,                [facility] PT Assessment - 07/20/21 0001       Assessment   Medical Diagnosis R HS pain, lbp    Referring Provider (PT) Charlann Boxer    Prior Therapy for foot       Precautions   Precautions None      Balance Screen   Has the patient fallen in the past 6 months No      Prior Function   Level of Independence Independent      Cognition   Overall Cognitive Status Within Functional Limits for tasks assessed      Posture/Postural Control   Posture Comments Standing: R hip slightly lower      ROM / Strength   AROM / PROM / Strength AROM;Strength      AROM   Overall AROM Comments lumbar: WFL, mild limitation for L SB.      Strength   Overall Strength Comments hips: 4/5, Knee: 5/5      Palpation   Palpation comment Tenderness in L medial/upper HS and palpation. Minimal LBP to palpate today; Limitation at end range for full HS mobility compared to L side.      Special Tests   Other special tests minimal pain in R HS with resisted knee flexion;  Neg SLR,  SLS/stability: WFL bil  Objective measurements completed on examination: See above findings.       [facility] Adult PT Treatment/Exercise - 07/20/21 0001       Exercises   Exercises Lumbar      Lumbar Exercises: Stretches   Active Hamstring Stretch 3 reps;30 seconds    Active Hamstring Stretch Limitations seated and supine with strap    Piriformis Stretch 3 reps;30 seconds    Piriformis Stretch Limitations supine fig 4;    Other Lumbar Stretch Exercise Childs pose L, R, center x 30 sec each;      Manual Therapy   Manual Therapy Joint mobilization;Soft tissue mobilization    Manual therapy comments skilled palpation and monitoring of soft tissue with dry needling.    Soft tissue mobilization DTM/TPR to med and lat HS.              Trigger Point Dry Needling - 07/20/21 0001     Consent Given? Yes    Education Handout Provided Yes    Muscles Treated Lower Quadrant Adductor longus/brevis/magnus;Hamstring    Adductor Response Twitch response elicited;Palpable increased muscle length   R   Hamstring Response Twitch response elicited;Palpable  increased muscle length   R med and lateral                  PT Education - 07/20/21 1303     Education Details PT POC, Exam findings, HEP    Person(s) Educated Patient    Methods Explanation;Demonstration;Tactile cues;Verbal cues;Handout    Comprehension Verbalized understanding;Returned demonstration;Verbal cues required;Tactile cues required;Need further instruction              PT Short Term Goals - 07/20/21 1305       PT SHORT TERM GOAL #1   Title PT to be independent with initial HEP    Time 2    Period Weeks    Status New    Target Date 08/03/21               PT Long Term Goals - 07/20/21 1306       PT LONG TERM GOAL #1   Title Pt will be independent with final HEP for LE strength    Time 6    Period Weeks    Status New    Target Date 08/31/21      PT LONG TERM GOAL #2   Title Pt to demo improved length of R HS to be within 5-10 deg of L side.    Time 6    Period Weeks    Status New    Target Date 08/31/21      PT LONG TERM GOAL #3   Title Pt to report decreased pain in R HS to 0-1/10 with stretching, exercise, and IADLS.    Time 6    Period Weeks    Status New    Target Date 08/31/21      PT LONG TERM GOAL #4   Title Pt to demo ability for squat, deadlift, and HS strengthening without pain , to improve ability for funcitonal activities and IADLs.    Time 6    Period Weeks    Status New    Target Date 08/31/21                    Plan - 07/20/21 1308     Clinical Impression Statement Pt presents with primary compalint of increased pain in low back and R hamstring. She does have tightness in  R hamstring and limtitations in motion, compared to L side. She has increased pain with stretching, less pain with resisted testing of HS today. She has tenderness in mid/lateral HS as well as upper/medial HS and adductors. Pt also with stiffness in R ankle for DF. She has had previous foot/ankle pain,but is having minimal pain at this  time. Pt with decreased ability for full functional activities and exercise, due to pain. Pt to benefit from skilled PT to improve deficits and pain.    Personal Factors and Comorbidities Past/Current Experience;Time since onset of injury/illness/exacerbation    Examination-Activity Limitations Squat;Bend;Stairs;Lift    Examination-Participation Restrictions Cleaning;Community Activity    Stability/Clinical Decision Making Stable/Uncomplicated    Clinical Decision Making Low    Rehab Potential Good    PT Frequency 1x / week    PT Duration 6 weeks    PT Treatment/Interventions ADLs/Self Care Home Management;Cryotherapy;Electrical Stimulation;Iontophoresis '4mg'$ /ml Dexamethasone;Moist Heat;Traction;Balance training;Therapeutic exercise;Therapeutic activities;Functional mobility training;Stair training;Gait training;DME Instruction;Ultrasound;Neuromuscular re-education;Orthotic Fit/Training;Patient/family education;Manual techniques;Vestibular;Vasopneumatic Device;Taping;Passive range of motion;Dry needling;Spinal Manipulations;Joint Manipulations    PT Home Exercise Plan [email or web address]    Consulted and Agree with Plan of Care Patient             Patient will benefit from skilled therapeutic intervention in order to improve the following deficits and impairments:  Hypomobility, Pain, Decreased strength, Decreased activity tolerance, Decreased mobility, Increased muscle spasms, Decreased range of motion, Impaired flexibility  Visit Diagnosis: Pain in right leg  Chronic bilateral low back pain without sciatica     Problem List Patient Active Problem List   Diagnosis Date Noted   Metatarsalgia 05/05/2021   Greater trochanteric bursitis of right hip 09/12/2018   Right lateral epicondylitis 02/16/2018   Iron deficiency anemia 01/09/2018   Nonallopathic lesion of thoracic region 02/26/2016   Nonallopathic lesion of lumbar region 02/26/2016   Right hamstring injury 02/11/2014   Neck pain on  right side 07/13/2013   Nonallopathic lesion of cervical region 07/13/2013   Patellar contusion 07/13/2013   Acromioclavicular separation, type 1 12/19/2012   Hip pain, right 02/02/2012   Abnormality of gait 02/02/2012    Lyndee Hensen, PT, DPT 1:20 PM  07/20/21    [facility] [address] [place], [place], [address] Phone: [phone]   Fax:  [phone]  Name: Alimah Garone MRN: [id number] Date of Birth: February 07, 1966

## 2021-07-21 ENCOUNTER — Other Ambulatory Visit: Payer: Self-pay | Admitting: Family Medicine

## 2021-07-27 ENCOUNTER — Ambulatory Visit: Payer: BC Managed Care – PPO | Admitting: Physical Therapy

## 2021-07-27 ENCOUNTER — Encounter: Payer: BC Managed Care – PPO | Admitting: Physical Therapy

## 2021-07-27 ENCOUNTER — Encounter: Payer: Self-pay | Admitting: Physical Therapy

## 2021-07-27 ENCOUNTER — Other Ambulatory Visit: Payer: Self-pay

## 2021-07-27 DIAGNOSIS — M79604 Pain in right leg: Secondary | ICD-10-CM

## 2021-07-27 DIAGNOSIS — M545 Low back pain, unspecified: Secondary | ICD-10-CM

## 2021-07-27 DIAGNOSIS — G8929 Other chronic pain: Secondary | ICD-10-CM | POA: Diagnosis not present

## 2021-07-28 NOTE — Therapy (Signed)
[facility] [address] [place], [place], [address] Phone: [phone]   Fax:  [phone]  Physical Therapy Treatment  Patient Details  Name: Gina [name] MRN: [id number] Date of Birth: [birth year] Referring Provider (PT): Charlann Boxer   Encounter Date: 07/27/2021   PT End of Session - 07/28/21 1412     Visit Number 2    Number of Visits 12    Date for PT Re-Evaluation 08/31/21    Authorization Type BCBS    PT Start Time 1517    PT Stop Time 1558    PT Time Calculation (min) 41 min    Activity Tolerance Patient tolerated treatment well    Behavior During Therapy [facility] for tasks assessed/performed             Past Medical History:  Diagnosis Date   Acute meniscal tear of knee    Allergy    Migraine     History reviewed. No pertinent surgical history.  There were no vitals filed for this visit.   Subjective Assessment - 07/27/21 1526     Subjective Pt states less pain in HS, low back still sore.    Currently in Pain? Yes    Pain Score 3     Pain Location Leg    Pain Orientation Right    Pain Descriptors / Indicators Aching;Tightness    Pain Type Acute pain;Chronic pain    Pain Onset More than a month ago    Pain Frequency Intermittent                               [facility] Adult PT Treatment/Exercise - 07/28/21 0001       Posture/Postural Control   Posture Comments Standing: R hip slightly lower      Exercises   Exercises Lumbar      Lumbar Exercises: Stretches   Active Hamstring Stretch 3 reps;30 seconds    Active Hamstring Stretch Limitations seated and supine with strap    Lower Trunk Rotation 5 reps;10 seconds    Pelvic Tilt 15 reps    Piriformis Stretch 3 reps;30 seconds    Piriformis Stretch Limitations supine fig 4;      Lumbar Exercises: Aerobic   Recumbent Bike L1 x 5 min;      Lumbar Exercises: Supine   Bridge 20 reps      Lumbar Exercises: Sidelying   Hip  Abduction 10 reps;Both      Manual Therapy   Manual Therapy Joint mobilization;Soft tissue mobilization    Manual therapy comments skilled palpation and monitoring of soft tissue with dry needling.    Soft tissue mobilization DTM/TPR to glute med, min, piriformis              Trigger Point Dry Needling - 07/28/21 0001     Consent Given? Yes    Education Handout Provided Previously provided    Muscles Treated Back/Hip Gluteus minimus;Gluteus medius;Piriformis    Gluteus Minimus Response Palpable increased muscle length;Twitch response elicited   R   Gluteus Medius Response Palpable increased muscle length   R   Piriformis Response Palpable increased muscle length   R                  PT Education - 07/28/21 1412     Education Details updated HEP    Person(s) Educated Patient    Methods Explanation;Demonstration;Tactile cues;Verbal cues;Handout    Comprehension Verbalized  understanding;Returned demonstration;Verbal cues required;Tactile cues required;Need further instruction              PT Short Term Goals - 07/20/21 1305       PT SHORT TERM GOAL #1   Title PT to be independent with initial HEP    Time 2    Period Weeks    Status New    Target Date 08/03/21               PT Long Term Goals - 07/20/21 1306       PT LONG TERM GOAL #1   Title Pt will be independent with final HEP for LE strength    Time 6    Period Weeks    Status New    Target Date 08/31/21      PT LONG TERM GOAL #2   Title Pt to demo improved length of R HS to be within 5-10 deg of L side.    Time 6    Period Weeks    Status New    Target Date 08/31/21      PT LONG TERM GOAL #3   Title Pt to report decreased pain in R HS to 0-1/10 with stretching, exercise, and IADLS.    Time 6    Period Weeks    Status New    Target Date 08/31/21      PT LONG TERM GOAL #4   Title Pt to demo ability for squat, deadlift, and HS strengthening without pain , to improve ability for  funcitonal activities and IADLs.    Time 6    Period Weeks    Status New    Target Date 08/31/21                   Plan - 07/28/21 1413     Clinical Impression Statement Improved HS mobility noted on R today from previous visit. Ther ex added today for lumbar and hip strength/stability, pt to benefit from continued progression of this. Increased tenderness in R low back and glute today, addressed with manual and dry needling. Pt to benefit from continued manual, as well as progressive strengthening.    Personal Factors and Comorbidities Past/Current Experience;Time since onset of injury/illness/exacerbation    Examination-Activity Limitations Squat;Bend;Stairs;Lift    Examination-Participation Restrictions Cleaning;Community Activity    Stability/Clinical Decision Making Stable/Uncomplicated    Rehab Potential Good    PT Frequency 1x / week    PT Duration 6 weeks    PT Treatment/Interventions ADLs/Self Care Home Management;Cryotherapy;Electrical Stimulation;Iontophoresis 4mg /ml Dexamethasone;Moist Heat;Traction;Balance training;Therapeutic exercise;Therapeutic activities;Functional mobility training;Stair training;Gait training;DME Instruction;Ultrasound;Neuromuscular re-education;Orthotic Fit/Training;Patient/family education;Manual techniques;Vestibular;Vasopneumatic Device;Taping;Passive range of motion;Dry needling;Spinal Manipulations;Joint Manipulations    PT Home Exercise Plan [email or web address]    Consulted and Agree with Plan of Care Patient             Patient will benefit from skilled therapeutic intervention in order to improve the following deficits and impairments:  Hypomobility, Pain, Decreased strength, Decreased activity tolerance, Decreased mobility, Increased muscle spasms, Decreased range of motion, Impaired flexibility  Visit Diagnosis: Pain in right leg  Chronic bilateral low back pain without sciatica     Problem List Patient Active Problem List    Diagnosis Date Noted   Metatarsalgia 05/05/2021   Greater trochanteric bursitis of right hip 09/12/2018   Right lateral epicondylitis 02/16/2018   Iron deficiency anemia 01/09/2018   Nonallopathic lesion of thoracic region 02/26/2016   Nonallopathic lesion of lumbar region 02/26/2016   Right  hamstring injury 02/11/2014   Neck pain on right side 07/13/2013   Nonallopathic lesion of cervical region 07/13/2013   Patellar contusion 07/13/2013   Acromioclavicular separation, type 1 12/19/2012   Hip pain, right 02/02/2012   Abnormality of gait 02/02/2012    Lyndee Hensen, PT, DPT 2:15 PM  07/28/21    [facility] [place], [place], [address] Phone: [phone]   Fax:  [phone]  Name: Gina [name] MRN: [id number] Date of Birth: 11-Apr-1966

## 2021-07-30 ENCOUNTER — Ambulatory Visit: Payer: BC Managed Care – PPO | Admitting: Physical Therapy

## 2021-07-30 ENCOUNTER — Other Ambulatory Visit: Payer: Self-pay

## 2021-07-30 ENCOUNTER — Encounter: Payer: Self-pay | Admitting: Physical Therapy

## 2021-07-30 DIAGNOSIS — M545 Low back pain, unspecified: Secondary | ICD-10-CM

## 2021-07-30 DIAGNOSIS — M79604 Pain in right leg: Secondary | ICD-10-CM

## 2021-07-30 DIAGNOSIS — G8929 Other chronic pain: Secondary | ICD-10-CM

## 2021-07-30 NOTE — Therapy (Addendum)
[facility] [address] [place], [place], [address] Phone: [phone]   Fax:  [phone]  Physical Therapy Treatment  Patient Details  Name: Gina [name] MRN: [id number] Date of Birth: [birth year] Referring Provider (PT): Charlann Boxer   Encounter Date: 07/30/2021   PT End of Session - 07/30/21 1249     Visit Number 3    Number of Visits 12    Date for PT Re-Evaluation 08/31/21    Authorization Type BCBS    PT Start Time 1016    PT Stop Time 1100    PT Time Calculation (min) 44 min    Activity Tolerance Patient tolerated treatment well    Behavior During Therapy [facility] for tasks assessed/performed             Past Medical History:  Diagnosis Date   Acute meniscal tear of knee    Allergy    Migraine     History reviewed. No pertinent surgical history.  There were no vitals filed for this visit.   Subjective Assessment - 07/30/21 1247     Subjective Pt states less pain and tightness in HS. Low back has been sore with activity.    Currently in Pain? Yes    Pain Score 2     Pain Location Leg    Pain Orientation Right    Pain Descriptors / Indicators Aching;Tightness    Pain Type Chronic pain;Acute pain    Pain Onset More than a month ago    Pain Frequency Intermittent    Multiple Pain Sites Yes    Pain Score 3    Pain Location Back    Pain Orientation Right;Left    Pain Descriptors / Indicators Aching    Pain Type Chronic pain;Acute pain    Pain Onset More than a month ago    Pain Frequency Intermittent                               [facility] Adult PT Treatment/Exercise - 07/30/21 0001       Posture/Postural Control   Posture Comments --      Exercises   Exercises Lumbar      Lumbar Exercises: Stretches   Active Hamstring Stretch 3 reps;30 seconds    Active Hamstring Stretch Limitations seated    Lower Trunk Rotation 5 reps;10 seconds    Pelvic Tilt 15 reps    Piriformis Stretch 3  reps;30 seconds    Piriformis Stretch Limitations supine fig 4;      Lumbar Exercises: Aerobic   Recumbent Bike L2 x 5 min;      Lumbar Exercises: Standing   Functional Squats 20 reps    Functional Squats Limitations 20 lb    Lifting Limitations Dead lift 10 lb x 15, with education on form.    Other Standing Lumbar Exercises SL modified RDL, reach to chair x 10 bil;    Other Standing Lumbar Exercises SLS with head turns x10 bil;      Lumbar Exercises: Supine   Bridge 20 reps    Single Leg Bridge 10 reps      Lumbar Exercises: Sidelying   Hip Abduction --      Lumbar Exercises: Quadruped   Plank 45 sec x 3;      Manual Therapy   Manual Therapy Joint mobilization;Soft tissue mobilization    Manual therapy comments --    Soft tissue mobilization DTM/TPR to R  HS, (medial and lateral), adductor.                     PT Education - 07/30/21 1248     Education Details updated HEP, discussed slowly increasing strength of hamstring.    Person(s) Educated Patient    Methods Explanation;Demonstration;Tactile cues;Verbal cues;Handout    Comprehension Verbalized understanding;Returned demonstration;Verbal cues required;Tactile cues required;Need further instruction              PT Short Term Goals - 07/30/21 1249       PT SHORT TERM GOAL #1   Title PT to be independent with initial HEP    Time 2    Period Weeks    Status Achieved    Target Date 08/03/21               PT Long Term Goals - 07/20/21 1306       PT LONG TERM GOAL #1   Title Pt will be independent with final HEP for LE strength    Time 6    Period Weeks    Status New    Target Date 08/31/21      PT LONG TERM GOAL #2   Title Pt to demo improved length of R HS to be within 5-10 deg of L side.    Time 6    Period Weeks    Status New    Target Date 08/31/21      PT LONG TERM GOAL #3   Title Pt to report decreased pain in R HS to 0-1/10 with stretching, exercise, and IADLS.    Time 6     Period Weeks    Status New    Target Date 08/31/21      PT LONG TERM GOAL #4   Title Pt to demo ability for squat, deadlift, and HS strengthening without pain , to improve ability for funcitonal activities and IADLs.    Time 6    Period Weeks    Status New    Target Date 08/31/21                   Plan - 07/30/21 1250     Clinical Impression Statement Pt with mild tenderness in med>lat hamstring with palpation and tissue release today. Education on ther ex for core, hip , and hamstring strength today. Pt with improved squat mechanics after education and practice, she has tendency for decreased weight and loading to the R side. Educated on hamstring activation and increasing strengthening with HEP, due to ongoing strains and pain in this area. She has been doing a lot of stretching, but limited strength for this region. Also discussed importance of core strengtheing for back pain. Pt going away next week, and also may have insurance that is ending at end of the month. Pt would benefit from continued care, would have to be self pay.    Personal Factors and Comorbidities Past/Current Experience;Time since onset of injury/illness/exacerbation    Examination-Activity Limitations Squat;Bend;Stairs;Lift    Examination-Participation Restrictions Cleaning;Community Activity    Stability/Clinical Decision Making Stable/Uncomplicated    Rehab Potential Good    PT Frequency 1x / week    PT Duration 6 weeks    PT Treatment/Interventions ADLs/Self Care Home Management;Cryotherapy;Electrical Stimulation;Iontophoresis 74m/ml Dexamethasone;Moist Heat;Traction;Balance training;Therapeutic exercise;Therapeutic activities;Functional mobility training;Stair training;Gait training;DME Instruction;Ultrasound;Neuromuscular re-education;Orthotic Fit/Training;Patient/family education;Manual techniques;Vestibular;Vasopneumatic Device;Taping;Passive range of motion;Dry needling;Spinal Manipulations;Joint  Manipulations    PT Home Exercise Plan [email or web address]    Consulted and Agree  with Plan of Care Patient             Patient will benefit from skilled therapeutic intervention in order to improve the following deficits and impairments:  Hypomobility, Pain, Decreased strength, Decreased activity tolerance, Decreased mobility, Increased muscle spasms, Decreased range of motion, Impaired flexibility  Visit Diagnosis: Pain in right leg  Chronic bilateral low back pain without sciatica     Problem List Patient Active Problem List   Diagnosis Date Noted   Metatarsalgia 05/05/2021   Greater trochanteric bursitis of right hip 09/12/2018   Right lateral epicondylitis 02/16/2018   Iron deficiency anemia 01/09/2018   Nonallopathic lesion of thoracic region 02/26/2016   Nonallopathic lesion of lumbar region 02/26/2016   Right hamstring injury 02/11/2014   Neck pain on right side 07/13/2013   Nonallopathic lesion of cervical region 07/13/2013   Patellar contusion 07/13/2013   Acromioclavicular separation, type 1 12/19/2012   Hip pain, right 02/02/2012   Abnormality of gait 02/02/2012    Lyndee Hensen, PT, DPT 12:55 PM  07/30/21    [facility] [place], [place], [address] Phone: [phone]   Fax:  [phone]  Name: Gina [name] MRN: [id number] Date of Birth: 01-13-1966  PHYSICAL THERAPY DISCHARGE SUMMARY  Visits from Start of Care:3 Plan: Patient agrees to discharge.  Patient goals were partially met. Patient is being discharged due to - not returning since last visit likely from insurance issues.     Lyndee Hensen, PT, DPT 11:29 AM  11/11/21

## 2021-07-30 NOTE — Patient Instructions (Signed)
Access Code: [email or web address] URL: [email or web address] Date: 07/30/2021 Prepared by: Lyndee Hensen  Exercises Supine Hamstring Stretch with Strap - 2 x daily - 3 reps - 30 hold Seated Hamstring Stretch - 2 x daily - 3 reps - 30 hold Supine Figure 4 Piriformis Stretch - 2 x daily - 3 reps - 30 hold Child's Pose with Sidebending - 2 x daily - 3 reps - 30 hold Supine Posterior Pelvic Tilt - 2 x daily - 1 sets - 10 reps Supine Lower Trunk Rotation - 2 x daily - 10 reps - 5 hold Supine Bridge - 1 x daily - 2 sets - 10 reps Sidelying Hip Abduction - 1 x daily - 1-2 sets - 10 reps Forward Reach with Kettlebell - 1 x daily - 2 sets - 10 reps Half Deadlift with Kettlebell - 1 x daily - 2 sets - 10 reps Single Leg Stance - 1 x daily - 3 reps - 30 hold Mini Squat - 1 x daily - 2 sets - 10 reps

## 2021-07-31 ENCOUNTER — Ambulatory Visit: Payer: BC Managed Care – PPO | Admitting: Physician Assistant

## 2021-08-03 ENCOUNTER — Ambulatory Visit: Payer: BC Managed Care – PPO | Admitting: Family Medicine

## 2021-08-05 ENCOUNTER — Ambulatory Visit: Payer: BC Managed Care – PPO | Admitting: Physician Assistant

## 2021-09-10 ENCOUNTER — Ambulatory Visit: Payer: BC Managed Care – PPO | Admitting: Family Medicine

## 2021-10-21 ENCOUNTER — Encounter: Payer: Self-pay | Admitting: Hematology and Oncology

## 2021-10-28 ENCOUNTER — Ambulatory Visit: Payer: BC Managed Care – PPO | Admitting: Physician Assistant

## 2022-02-26 ENCOUNTER — Encounter: Payer: Self-pay | Admitting: Family Medicine

## 2022-02-26 ENCOUNTER — Encounter: Payer: Self-pay | Admitting: Hematology and Oncology

## 2022-02-26 ENCOUNTER — Ambulatory Visit (INDEPENDENT_AMBULATORY_CARE_PROVIDER_SITE_OTHER): Payer: 59 | Admitting: Family Medicine

## 2022-02-26 VITALS — BP 132/84 | HR 72 | Ht 66.0 in | Wt 141.0 lb

## 2022-02-26 DIAGNOSIS — M9902 Segmental and somatic dysfunction of thoracic region: Secondary | ICD-10-CM | POA: Diagnosis not present

## 2022-02-26 DIAGNOSIS — M542 Cervicalgia: Secondary | ICD-10-CM | POA: Diagnosis not present

## 2022-02-26 DIAGNOSIS — M79641 Pain in right hand: Secondary | ICD-10-CM | POA: Diagnosis not present

## 2022-02-26 DIAGNOSIS — M9901 Segmental and somatic dysfunction of cervical region: Secondary | ICD-10-CM | POA: Diagnosis not present

## 2022-02-26 DIAGNOSIS — M9903 Segmental and somatic dysfunction of lumbar region: Secondary | ICD-10-CM

## 2022-02-26 MED ORDER — MELOXICAM 7.5 MG PO TABS: 7.5000 mg | ORAL_TABLET | Freq: Every day | ORAL | 0 refills | Status: DC

## 2022-02-26 NOTE — Patient Instructions (Signed)
Good to see you ?Buddy tape ?Stop doing dry wall ?Arnica lotion ?Meloxicam 7.5 daily for 10 days then as needed ?See me in 5 weeks if not gone or need a tune up ?

## 2022-02-26 NOTE — Assessment & Plan Note (Signed)
Somewhat better since patient has not been doing certain jobs.  Discussed with patient about icing regimen and home exercises and posture.  Patient is doing a job where she is significantly more active.  Follow-up with me again in 6 to 8 weeks otherwise. ?

## 2022-02-26 NOTE — Progress Notes (Signed)
?Charlann Boxer D.O. ?[facility] ?[place] ?Phone: [phone] ?Subjective:   ?I, [name], am serving as a scribe for Dr. Hulan Saas. ? ?This visit occurred during the SARS-CoV-2 public health emergency.  Safety protocols were in place, including screening questions prior to the visit, additional usage of staff PPE, and extensive cleaning of exam room while observing appropriate contact time as indicated for disinfecting solutions.  ? ? ?I'm seeing this patient by the request  of:  Hayden Rasmussen, MD ? ?CC: hand pain, neck pain  ? ?[id number]  ?Gina [name] is a 56 y.o. female coming in with complaint of R hand pain over 3rd metacarpal. Notices swelling, bruising, and pain over area. Hurts to use hand. Denies any numbness or tingling. Does not remember an injury  ? ? ? ?  ? ?Past Medical History:  ?Diagnosis Date  ? Acute meniscal tear of knee   ? Allergy   ? Migraine   ? ?No past surgical history on file. ?Social History  ? ?Socioeconomic History  ? Marital status: Significant Other  ?  Spouse name: Not on file  ? Number of children: Not on file  ? Years of education: Not on file  ? Highest education level: Not on file  ?Occupational History  ? Not on file  ?Tobacco Use  ? Smoking status: Never  ? Smokeless tobacco: Never  ?Substance and Sexual Activity  ? Alcohol use: Yes  ? Drug use: No  ? Sexual activity: Yes  ?  Comment: with females  ?Other Topics Concern  ? Not on file  ?Social History Narrative  ? ** Merged History Encounter **  ?    ? ?Social Determinants of Health  ? ?Financial Resource Strain: Not on file  ?Food Insecurity: Not on file  ?Transportation Needs: Not on file  ?Physical Activity: Not on file  ?Stress: Not on file  ?Social Connections: Not on file  ? ?No Known Allergies ?No family history on file. ? ?Current Outpatient Medications (Endocrine & Metabolic):  ?  progesterone (PROMETRIUM) 100 MG capsule, progesterone micronized 100 mg  capsule  Take 1 capsule every day by oral route. ? ? ? ?Current Outpatient Medications (Analgesics):  ?  meloxicam (MOBIC) 7.5 MG tablet, Take 1 tablet (7.5 mg total) by mouth daily. ? ? ?Current Outpatient Medications (Other):  ?  carisoprodol (SOMA) 350 MG tablet, Take 1 tablet (350 mg total) by mouth 3 (three) times daily as needed for muscle spasms. ?  clonazePAM (KLONOPIN) 0.5 MG tablet, clonazepam 0.5 mg tablet ?  cyclobenzaprine (FLEXERIL) 5 MG tablet, Take 1 tablet (5 mg total) by mouth at bedtime as needed for muscle spasms. ?  diazepam (VALIUM) 10 MG tablet, Take 1 tablet (10 mg total) by mouth every 12 (twelve) hours as needed. One tab by mouth, 2 hours before procedure. ?  gabapentin (NEURONTIN) 100 MG capsule, Take 2 capsules (200 mg total) by mouth at bedtime. ?  LamoTRIgine (LAMICTAL XR) 50 MG TB24 24 hour tablet, Take 1 tablet (50 mg total) by mouth daily. ?  lamoTRIgine (LAMICTAL) 25 MG tablet, SMARTSIG:1 Tablet(s) By Mouth Every Evening ?  Testosterone 20 % CREA, Compounded Testosterone Cream/Gel 2%  Testosterone Proprionate 2 mg/gram in gel or cream base  use small pea sized amount as directed once a week ? ? ?Reviewed prior external information including notes and imaging from  ?primary care provider ?As well as notes that were available from care everywhere and other  healthcare systems. ? ?Past medical history, social, surgical and family history all reviewed in electronic medical record.  No pertanent information unless stated regarding to the chief complaint.  ? ?Review of Systems: ? No headache, visual changes, nausea, vomiting, diarrhea, constipation, dizziness, abdominal pain, skin rash, fevers, chills, night sweats, weight loss, swollen lymph nodes, body aches, joint swelling, chest pain, shortness of breath, mood changes. POSITIVE muscle aches ? ?Objective  ?Blood pressure 132/84, pulse 72, height '5\' 6"'$  (1.676 m), weight 141 lb (64 kg), SpO2 99 %. ?  ?General: No apparent distress alert  and oriented x3 mood and affect normal, dressed appropriately.  ?HEENT: Pupils equal, extraocular movements intact  ?Respiratory: Patient's speak in full sentences and does not appear short of breath  ?Cardiovascular: No lower extremity edema, non tender, no erythema  ?Gait normal with good balance and coordination.  ?MSK:  right hand mild swelling noted over the 3rd MCP, full ROM, mild TTP. No instability noted  ? ?Neck exam does have los sof ROM, tightness right side of neck. Neg spurling.  ? ?5/5 strength of upper extremity  ? ?Osteopathic findings ? ?C4 flexed rotated and side bent left ?C6 flexed rotated and side bent left ?T9 extended rotated and side bent left ?L2 flexed rotated and side bent right ?Sacrum right on right ? ?Limited muscular skeletal ultrasound was performed and interpreted by Hulan Saas, M  ?U/s of right 3rd mcp does have cortical irregularity noted mild hypoechoic changes of the joint, mild neovascularization to the 2rd/2nd interdigital space. Nno acute injury noted.  ?Impression: cortical irregularity consistent with potential healing fracture.  ?  ?Impression and Recommendations:  ?  ?The above documentation has been reviewed and is accurate and complete [name], DO ? ? ? ?

## 2022-02-26 NOTE — Assessment & Plan Note (Signed)
Patient has an area that does seem to be a fracture.  Patient does not remember any true injury though.  I discussed with patient about icing regimen and home exercises, we discussed with patient about buddy taping.  There is a possibility that this going on longer than a month that there could be a ligamentous injury but at the moment I do not think it would change management.  Discussed icing regimen and home exercises, which activities to do which ones to avoid, increase activity slowly.  Follow-up again in 5 weeks if not completely resolved ?

## 2022-03-03 ENCOUNTER — Ambulatory Visit: Payer: 59 | Admitting: Physician Assistant

## 2022-03-03 ENCOUNTER — Encounter: Payer: Self-pay | Admitting: Hematology and Oncology

## 2022-03-03 ENCOUNTER — Encounter: Payer: Self-pay | Admitting: Physician Assistant

## 2022-03-03 DIAGNOSIS — D224 Melanocytic nevi of scalp and neck: Secondary | ICD-10-CM

## 2022-03-03 DIAGNOSIS — Z808 Family history of malignant neoplasm of other organs or systems: Secondary | ICD-10-CM | POA: Diagnosis not present

## 2022-03-03 DIAGNOSIS — Z1283 Encounter for screening for malignant neoplasm of skin: Secondary | ICD-10-CM | POA: Diagnosis not present

## 2022-03-03 DIAGNOSIS — Z86018 Personal history of other benign neoplasm: Secondary | ICD-10-CM

## 2022-03-03 DIAGNOSIS — D485 Neoplasm of uncertain behavior of skin: Secondary | ICD-10-CM

## 2022-03-03 NOTE — Patient Instructions (Signed)

## 2022-03-03 NOTE — Progress Notes (Signed)
? ?  Follow-Up Visit ?  ?Subjective  ?Gina [name] is a 56 y.o. female who presents for the following: Annual Exam (Patient here today for yearly skin check per patient she has a lesion on the right side of her neck x years that does itch. Personal history of atypical moles. Family history of non mole skin cancer. No personal history or family history of melanoma. No personal history of non mole skin cancer. ). ? ? ?The following portions of the chart were reviewed this encounter and updated as appropriate:  Tobacco  Allergies  Meds  Problems  Med Hx  Surg Hx  Fam Hx   ?  ? ?Objective  ?Well appearing patient in no apparent distress; mood and affect are within normal limits. ? ?A full examination was performed including scalp, head, eyes, ears, nose, lips, neck, chest, axillae, abdomen, back, buttocks, bilateral upper extremities, bilateral lower extremities, hands, feet, fingers, toes, fingernails, and toenails. All findings within normal limits unless otherwise noted below. ? ?Right Upper Posterior Neck ?Pink plaque ? ? ? ? ? ? ? ?Assessment & Plan  ?Neoplasm of uncertain behavior of skin ?Right Upper Posterior Neck ? ?Skin / nail biopsy ?Type of biopsy: tangential   ?Informed consent: discussed and consent obtained   ?Timeout: patient name, date of birth, surgical site, and procedure verified   ?Procedure prep:  Patient was prepped and draped in usual sterile fashion (Non sterile) ?Prep type:  Chlorhexidine ?Anesthesia: the lesion was anesthetized in a standard fashion   ?Anesthetic:  1% lidocaine w/ epinephrine 1-100,000 local infiltration ?Instrument used: flexible razor blade   ?Hemostasis achieved with: aluminum chloride and electrodesiccation   ?Outcome: patient tolerated procedure well   ?Post-procedure details: sterile dressing applied and wound care instructions given   ?Dressing type: bandage and petrolatum   ? ?Specimen 1 - Surgical pathology ?Differential Diagnosis: R/O CN vs Atypia -  cautery after biopsy ? ?Check Margins: yes ? ?No atypical nevi noted at the time of the visit. ? ?I, Dariel Betzer, PA-C, have reviewed all documentation's for this visit.  The documentation on 03/03/22 for the exam, diagnosis, procedures and orders are all accurate and complete. ?

## 2022-03-25 ENCOUNTER — Ambulatory Visit: Payer: Self-pay | Admitting: Family Medicine

## 2022-04-01 NOTE — Progress Notes (Signed)
[name] Phone: [phone] Subjective:   [name], am serving as a scribe for Dr. Hulan Saas.   I'm seeing this patient by the request  of:  Hayden Rasmussen, MD  CC: hand pain follow up   [id number]  Jaylanie Boschee is a 56 y.o. female coming in with complaint of back and neck pain. OMT on 02/26/2022. Hand found to have a small fracture as well on 3rd MCPand mild 2nd interdigital space.   Patient states that she is unable to keep swelling down in hand. Painful with movement.   Medications patient has been prescribed: Mobic Gabapentin   Taking:         Reviewed prior external information including notes and imaging from previsou exam, outside providers and external EMR if available.   As well as notes that were available from care everywhere and other healthcare systems.  Past medical history, social, surgical and family history all reviewed in electronic medical record.  No pertanent information unless stated regarding to the chief complaint.   Past Medical History:  Diagnosis Date   Acute meniscal tear of knee    Allergy    Atypical mole 01/02/2008   Right Mid Paraspinal (slight)   Atypical mole 01/16/2009   Left Mid Paraspinal (moderate)   Atypical mole 10/07/2009   Mid Spinal (moderate to severe) Mindi Slicker)   Migraine     No Known Allergies   Review of Systems:  No headache, visual changes, nausea, vomiting, diarrhea, constipation, dizziness, abdominal pain, skin rash, fevers, chills, night sweats, weight loss, swollen lymph nodes, body aches, joint swelling, chest pain, shortness of breath, mood changes. POSITIVE muscle aches  Objective  Blood pressure 124/84, pulse 73, height '5\' 6"'$  (1.676 m), weight 139 lb (63 kg), last menstrual period 03/23/2022, SpO2 98 %.   General: No apparent distress alert and oriented x3 mood and affect normal, dressed appropriately.  HEENT:  Pupils equal, extraocular movements intact  Respiratory: Patient's speak in full sentences and does not appear short of breath  Cardiovascular: No lower extremity edema, non tender, no erythema  Right hand exam does have still swelling noted over the third MCP.  Patient has near full range of motion but mild tightness.  Neurovascular intact distally.  Good capillary refill.  Good grip strength  Limited muscular skeletal ultrasound was performed and interpreted by Hulan Saas, M  Limited ultrasound shows the patient still has hypoechoic changes of the joint space noted.  No cortical irregularity noted.  Still significant effusion noted of the joint. Impression: Third MCP effusion  Procedure: Real-time Ultrasound Guided Injection of right third MCP Device: GE Logiq Q7 Ultrasound guided injection is preferred based studies that show increased duration, increased effect, greater accuracy, decreased procedural pain, increased response rate, and decreased cost with ultrasound guided versus blind injection.  Verbal informed consent obtained.  Time-out conducted.  Noted no overlying erythema, induration, or other signs of local infection.  Skin prepped in a sterile fashion.  Local anesthesia: Topical Ethyl chloride.  With sterile technique and under real time ultrasound guidance: With a 25-gauge half inch needle injected with 0.5 cc of 0.5% Marcaine and 0.5 cc of Kenalog 40 mg/mL Completed without difficulty  Pain immediately improved suggesting accurate placement of the medication.  Advised to call if fevers/chills, erythema, induration, drainage, or persistent bleeding.  Impression: Technically successful ultrasound guided injection.     Assessment and Plan:  The above documentation has been reviewed and is accurate and complete [name], DO        Note: This dictation was prepared with Dragon dictation along with smaller phrase technology. Any transcriptional errors  that result from this process are unintentional.

## 2022-04-06 ENCOUNTER — Encounter: Payer: Self-pay | Admitting: Family Medicine

## 2022-04-06 ENCOUNTER — Ambulatory Visit: Payer: 59 | Admitting: Family Medicine

## 2022-04-06 ENCOUNTER — Ambulatory Visit: Payer: Self-pay

## 2022-04-06 ENCOUNTER — Ambulatory Visit (INDEPENDENT_AMBULATORY_CARE_PROVIDER_SITE_OTHER): Payer: 59

## 2022-04-06 VITALS — BP 124/84 | HR 73 | Ht 66.0 in | Wt 139.0 lb

## 2022-04-06 DIAGNOSIS — M79641 Pain in right hand: Secondary | ICD-10-CM

## 2022-04-06 MED ORDER — GABAPENTIN 300 MG PO CAPS: 300.0000 mg | ORAL_CAPSULE | Freq: Every day | ORAL | 0 refills | Status: DC

## 2022-04-06 NOTE — Patient Instructions (Signed)
Gabapentin '300mg'$  at night See me in 6-8 weeks

## 2022-04-06 NOTE — Assessment & Plan Note (Signed)
Patient had x-rays done today and independently visualized by me showing no significant bony abnormality.  We discussed icing regimen and home exercises otherwise.  Discussed signs and symptoms of any infectious etiology.  Follow-up with me again in 6 to 8 weeks to make sure patient is completely resolved at that time patient has noticed that the gabapentin has helped some of the pains will increase to 300 mg

## 2022-05-27 NOTE — Progress Notes (Signed)
[place] [place] Phone: [phone] Subjective:   [name], am serving as a scribe for Dr. Hulan Saas.  I'm seeing this patient by the request  of:  Hayden Rasmussen, MD  CC: Hand pain follow-up  [id number]  04/06/2022 Patient had x-rays done today and independently visualized by me showing no significant bony abnormality.  We discussed icing regimen and home exercises otherwise.  Discussed signs and symptoms of any infectious etiology.  Follow-up with me again in 6 to 8 weeks to make sure patient is completely resolved at that time patient has noticed that the gabapentin has helped some of the pains will increase to 300 mg  Update 06/01/2022 Teeghan Hammer is a 56 y.o. female coming in with complaint of R hand pain. Patient states that she feels that she is improving. Still notes swelling but has a pulling sensation in which her fingers feel like they are abducting.       Past Medical History:  Diagnosis Date   Acute meniscal tear of knee    Allergy    Atypical mole 01/02/2008   Right Mid Paraspinal (slight)   Atypical mole 01/16/2009   Left Mid Paraspinal (moderate)   Atypical mole 10/07/2009   Mid Spinal (moderate to severe) (widershave)   Migraine    No past surgical history on file. Social History   Socioeconomic History   Marital status: Significant Other    Spouse name: Not on file   Number of children: Not on file   Years of education: Not on file   Highest education level: Not on file  Occupational History   Not on file  Tobacco Use   Smoking status: Never   Smokeless tobacco: Never  Substance and Sexual Activity   Alcohol use: Yes   Drug use: No   Sexual activity: Yes    Comment: with females  Other Topics Concern   Not on file  Social History Narrative   ** Merged History Encounter **       Social Determinants of Health   Financial Resource Strain: Not on file  Food  Insecurity: Not on file  Transportation Needs: Not on file  Physical Activity: Not on file  Stress: Not on file  Social Connections: Not on file   No Known Allergies No family history on file.  Current Outpatient Medications (Endocrine & Metabolic):    progesterone (PROMETRIUM) 100 MG capsule, progesterone micronized 100 mg capsule  Take 1 capsule every day by oral route.    Current Outpatient Medications (Analgesics):    meloxicam (MOBIC) 7.5 MG tablet, Take 1 tablet (7.5 mg total) by mouth daily.   Current Outpatient Medications (Other):    carisoprodol (SOMA) 350 MG tablet, Take 1 tablet (350 mg total) by mouth 3 (three) times daily as needed for muscle spasms.   clonazePAM (KLONOPIN) 0.5 MG tablet, clonazepam 0.5 mg tablet   cyclobenzaprine (FLEXERIL) 5 MG tablet, Take 1 tablet (5 mg total) by mouth at bedtime as needed for muscle spasms.   diazepam (VALIUM) 10 MG tablet, Take 1 tablet (10 mg total) by mouth every 12 (twelve) hours as needed. One tab by mouth, 2 hours before procedure.   gabapentin (NEURONTIN) 300 MG capsule, Take 1 capsule (300 mg total) by mouth at bedtime.   Testosterone 20 % CREA, Compounded Testosterone Cream/Gel 2%  Testosterone Proprionate 2 mg/gram in gel or cream base  use small pea sized amount as directed once  a week   lamoTRIgine (LAMICTAL XR) 100 MG 24 hour tablet, Take 100 mg by mouth daily.   Objective  Blood pressure 124/78, pulse 66, height '5\' 6"'$  (1.676 m), weight 144 lb (65.3 kg), SpO2 99 %.   General: No apparent distress alert and oriented x3 mood and affect normal, dressed appropriately.  HEENT: Pupils equal, extraocular movements intact  Respiratory: Patient's speak in full sentences and does not appear short of breath  Cardiovascular: No lower extremity edema, non tender, no erythema  Right hand exam does have some mild enlargement compared to the contralateral side at the third MCP.  Full range of motion.  Negative pain with any type of  grip strength  Limited muscular skeletal ultrasound was performed and interpreted by Hulan Saas, M  Limited ultrasound shows no significant hypoechoic changes of the joint at this time.  Mild cortical irregularity with what appears to be a hard callus formation formed.  Mild spurring noted in this area that could be causing some impingement on the overlying extensor tendon.    Impression and Recommendations:     The above documentation has been reviewed and is accurate and complete [name], DO

## 2022-06-02 ENCOUNTER — Ambulatory Visit: Payer: Self-pay

## 2022-06-02 ENCOUNTER — Ambulatory Visit (INDEPENDENT_AMBULATORY_CARE_PROVIDER_SITE_OTHER): Payer: 59 | Admitting: Family Medicine

## 2022-06-02 ENCOUNTER — Encounter: Payer: Self-pay | Admitting: Family Medicine

## 2022-06-02 VITALS — BP 124/78 | HR 66 | Ht 66.0 in | Wt 144.0 lb

## 2022-06-02 DIAGNOSIS — M79641 Pain in right hand: Secondary | ICD-10-CM

## 2022-06-02 NOTE — Assessment & Plan Note (Signed)
Patient has had some improvement at this time.  On ultrasound no significant hypoechoic changes.  The patient does have a cortical irregularity that seems to be improving very slowly at the moment.  I believe that this could be causing some of the tightness noted.  Patient will increase activity slowly otherwise.  Follow-up again in 6 to 8 weeks.

## 2022-06-21 ENCOUNTER — Other Ambulatory Visit: Payer: Self-pay | Admitting: Family Medicine

## 2022-09-06 ENCOUNTER — Encounter: Payer: Self-pay | Admitting: Hematology and Oncology

## 2022-09-07 ENCOUNTER — Encounter: Payer: Self-pay | Admitting: Hematology and Oncology

## 2022-09-08 NOTE — Progress Notes (Signed)
[name] [place] Phone: [phone] Subjective:   Gina [name], am serving as a scribe for Dr. Hulan [name].  I'm seeing this patient by the request  of:  Hayden Rasmussen, MD  CC: Right hand and finger pain  [id number]  06/02/2022 Patient has had some improvement at this time.  On ultrasound no significant hypoechoic changes.  The patient does have a cortical irregularity that seems to be improving very slowly at the moment.  I believe that this could be causing some of the tightness noted.  Patient will increase activity slowly otherwise.  Follow-up again in 6 to 8 weeks.  Update 09/14/2022 Gina [name] is a 56 y.o. female coming in with complaint of R hand pain. Patient states that her hand is still painful. The knuckle is still swollen and she feels like there is almost a drop and when making a fist she can feel tightness down the fingers.  Patient states that she has a throbbing sensation that can is 6 around for quite some time.      Past Medical History:  Diagnosis Date   Acute meniscal tear of knee    Allergy    Atypical mole 01/02/2008   Right Mid Paraspinal (slight)   Atypical mole 01/16/2009   Left Mid Paraspinal (moderate)   Atypical mole 10/07/2009   Mid Spinal (moderate to severe) (widershave)   Migraine    No past surgical history on file. Social History   Socioeconomic History   Marital status: Significant Other    Spouse name: Not on file   Number of children: Not on file   Years of education: Not on file   Highest education level: Not on file  Occupational History   Not on file  Tobacco Use   Smoking status: Never   Smokeless tobacco: Never  Substance and Sexual Activity   Alcohol use: Yes   Drug use: No   Sexual activity: Yes    Comment: with females  Other Topics Concern   Not on file  Social History Narrative   ** Merged History Encounter **       Social  Determinants of Health   Financial Resource Strain: Not on file  Food Insecurity: Not on file  Transportation Needs: Not on file  Physical Activity: Not on file  Stress: Not on file  Social Connections: Not on file   No Known Allergies No family history on file.  Current Outpatient Medications (Endocrine & Metabolic):    progesterone (PROMETRIUM) 100 MG capsule, progesterone micronized 100 mg capsule  Take 1 capsule every day by oral route.    Current Outpatient Medications (Analgesics):    ibuprofen (ADVIL) 600 MG tablet, TAKE 1 TABLET BY MOUTH EVERY 8 HOURS AS NEEDED   meloxicam (MOBIC) 7.5 MG tablet, Take 1 tablet (7.5 mg total) by mouth daily.   Current Outpatient Medications (Other):    carisoprodol (SOMA) 350 MG tablet, Take 1 tablet (350 mg total) by mouth 3 (three) times daily as needed for muscle spasms.   clonazePAM (KLONOPIN) 0.5 MG tablet, clonazepam 0.5 mg tablet   cyclobenzaprine (FLEXERIL) 5 MG tablet, Take 1 tablet (5 mg total) by mouth at bedtime as needed for muscle spasms.   diazepam (VALIUM) 10 MG tablet, Take 1 tablet (10 mg total) by mouth every 12 (twelve) hours as needed. One tab by mouth, 2 hours before procedure.   gabapentin (NEURONTIN) 300 MG capsule, Take 1 capsule by  mouth at bedtime   Testosterone 20 % CREA, Compounded Testosterone Cream/Gel 2%  Testosterone Proprionate 2 mg/gram in gel or cream base  use small pea sized amount as directed once a week   lamoTRIgine (LAMICTAL XR) 100 MG 24 hour tablet, Take 100 mg by mouth daily.   Reviewed prior external information including notes and imaging from  primary care provider As well as notes that were available from care everywhere and other healthcare systems.  Past medical history, social, surgical and family history all reviewed in electronic medical record.  No pertanent information unless stated regarding to the chief complaint.   Review of Systems:  No headache, visual changes, nausea, vomiting,  diarrhea, constipation, dizziness, abdominal pain, skin rash, fevers, chills, night sweats, weight loss, swollen lymph nodes, body aches, joint swelling, chest pain, shortness of breath, mood changes. POSITIVE muscle aches  Objective  Blood pressure 120/80, pulse 78, height '5\' 6"'$  (1.676 m), weight 138 lb (62.6 kg), SpO2 98 %.   General: No apparent distress alert and oriented x3 mood and affect normal, dressed appropriately.  HEENT: Pupils equal, extraocular movements intact  Respiratory: Patient's speak in full sentences and does not appear short of breath  Cardiovascular: No lower extremity edema, non tender, no erythema  Right hand exam shows patient does have more bony protuberance and possible inflammation of the third MCP.  Patient does have some limited range of motion noted as well. Neurovascularly intact distally.  Good range of motion of the wrist noted.  Limited muscular skeletal ultrasound was performed and interpreted by Gina [name], M  Limited ultrasound of patient's finger shows mild synovitis noted but no significant effusion of the third MCP.  Patient though does have what appears to be periosteal thickening noted in proximal to this area.  Some increase in neovascularization in Doppler flow.  Quite significant when comparing to the other side.  Patient does have mild soft tissue inflammation as well. Impression: Concern for periosteal thickening that seems to be abnormal with vascularity    Impression and Recommendations:    The above documentation has been reviewed and is accurate and complete [name], DO

## 2022-09-11 ENCOUNTER — Other Ambulatory Visit: Payer: Self-pay | Admitting: Family Medicine

## 2022-09-14 ENCOUNTER — Encounter: Payer: Self-pay | Admitting: Hematology and Oncology

## 2022-09-14 ENCOUNTER — Ambulatory Visit: Payer: BC Managed Care – PPO | Admitting: Family Medicine

## 2022-09-14 ENCOUNTER — Encounter: Payer: Self-pay | Admitting: Family Medicine

## 2022-09-14 ENCOUNTER — Ambulatory Visit: Payer: Self-pay

## 2022-09-14 VITALS — BP 120/80 | HR 78 | Ht 66.0 in | Wt 138.0 lb

## 2022-09-14 DIAGNOSIS — M659 Synovitis and tenosynovitis, unspecified: Secondary | ICD-10-CM

## 2022-09-14 DIAGNOSIS — R2231 Localized swelling, mass and lump, right upper limb: Secondary | ICD-10-CM

## 2022-09-14 DIAGNOSIS — M79641 Pain in right hand: Secondary | ICD-10-CM

## 2022-09-14 DIAGNOSIS — M65949 Unspecified synovitis and tenosynovitis, unspecified hand: Secondary | ICD-10-CM | POA: Insufficient documentation

## 2022-09-14 NOTE — Patient Instructions (Addendum)
Good to see you  MRI of your hand has been placed [phone] to call and schedule We will see what is going on and get in touch with you about next steps

## 2022-09-14 NOTE — Assessment & Plan Note (Signed)
Abnormal findings noted on ultrasound today.  Appears significantly different than previous exam.  Patient actually has what appears to be a periosteal thickening noted with significant increase in Doppler flow in neovascularization in the area.  This seems to be more extra-articular and does not have as much as the inflammation that was noted previously.  Secondary to the abnormal vascularity I do feel advanced imaging is warranted at this time to further evaluate for any type of occult fracture, rule out any infectious etiology, or even a early periosteal chondroma.  Patient has not had any other systemic findings that make me significantly c concern and do feel that after 6 months of treatments that this should be significantly improved.  The initial synovitis seems to be to a certain degree but still abnormal findings proximal to this area.  Depending on imaging we will discuss further treatment options.

## 2022-09-16 ENCOUNTER — Telehealth: Payer: Self-pay | Admitting: Family Medicine

## 2022-09-16 ENCOUNTER — Other Ambulatory Visit: Payer: Self-pay

## 2022-09-16 MED ORDER — DOXYCYCLINE HYCLATE 100 MG PO TABS: 100.0000 mg | ORAL_TABLET | Freq: Two times a day (BID) | ORAL | 0 refills | Status: DC

## 2022-09-16 NOTE — Telephone Encounter (Signed)
Patient called stating that she would like to go ahead and start the antibiotic for her right hand (as discussed with Dr Tamala Julian at her recent visit)  Pharmacy: [facility]

## 2022-09-16 NOTE — Telephone Encounter (Signed)
Rx filled

## 2022-10-07 ENCOUNTER — Ambulatory Visit: Payer: Self-pay | Admitting: Family Medicine

## 2022-11-23 DIAGNOSIS — N951 Menopausal and female climacteric states: Secondary | ICD-10-CM | POA: Insufficient documentation

## 2022-11-23 DIAGNOSIS — Z01419 Encounter for gynecological examination (general) (routine) without abnormal findings: Secondary | ICD-10-CM | POA: Diagnosis not present

## 2022-11-23 DIAGNOSIS — Z1231 Encounter for screening mammogram for malignant neoplasm of breast: Secondary | ICD-10-CM | POA: Diagnosis not present

## 2022-11-23 DIAGNOSIS — Z6822 Body mass index (BMI) 22.0-22.9, adult: Secondary | ICD-10-CM | POA: Diagnosis not present

## 2022-12-01 LAB — HM PAP SMEAR

## 2022-12-01 LAB — HM MAMMOGRAPHY

## 2022-12-23 ENCOUNTER — Other Ambulatory Visit: Payer: Self-pay | Admitting: Family Medicine

## 2023-03-03 NOTE — Progress Notes (Signed)
[facility] [address] [place] [address] Phone: [phone] Subjective:   Gina [name], am serving as a scribe for Dr. Antoine Primas.  I'm seeing this patient by the request  of:  Dois Davenport, MD  CC: Low back pain, left shoulder and elbow pain  [id number]  09/14/2022 Abnormal findings noted on ultrasound today.  Appears significantly different than previous exam.  Patient actually has what appears to be a periosteal thickening noted with significant increase in Doppler flow in neovascularization in the area.  This seems to be more extra-articular and does not have as much as the inflammation that was noted previously.  Secondary to the abnormal vascularity I do feel advanced imaging is warranted at this time to further evaluate for any type of occult fracture, rule out any infectious etiology, or even a early periosteal chondroma.  Patient has not had any other systemic findings that make me significantly c concern and do feel that after 6 months of treatments that this should be significantly improved.  The initial synovitis seems to be to a certain degree but still abnormal findings proximal to this area.  Depending on imaging we will discuss further treatment options.   Update 03/08/2023 Gina [name] is a 57 y.o. female coming in with complaint of L elbow and shoulder pain. Patient states injection in the L shoulder. Pain when pressure on elbow and radiates on the lateral side. Pain is achy in nature, but sharp with pressure.      Past Medical History:  Diagnosis Date   Acute meniscal tear of knee    Allergy    Atypical mole 01/02/2008   Right Mid Paraspinal (slight)   Atypical mole 01/16/2009   Left Mid Paraspinal (moderate)   Atypical mole 10/07/2009   Mid Spinal (moderate to severe) (widershave)   Migraine    No past surgical history on file. Social History   Socioeconomic History   Marital status: Single    Spouse  name: Not on file   Number of children: Not on file   Years of education: Not on file   Highest education level: Not on file  Occupational History   Not on file  Tobacco Use   Smoking status: Never   Smokeless tobacco: Never  Substance and Sexual Activity   Alcohol use: Yes   Drug use: No   Sexual activity: Yes    Comment: with females  Other Topics Concern   Not on file  Social History Narrative   ** Merged History Encounter **       Social Determinants of Health   Financial Resource Strain: Not on file  Food Insecurity: Not on file  Transportation Needs: Not on file  Physical Activity: Not on file  Stress: Not on file  Social Connections: Not on file   No Known Allergies No family history on file.  Current Outpatient Medications (Endocrine & Metabolic):    progesterone (PROMETRIUM) 100 MG capsule, progesterone micronized 100 mg capsule  Take 1 capsule every day by oral route.    Current Outpatient Medications (Analgesics):    ibuprofen (ADVIL) 600 MG tablet, TAKE 1 TABLET BY MOUTH EVERY 8 HOURS AS NEEDED   meloxicam (MOBIC) 7.5 MG tablet, Take 1 tablet (7.5 mg total) by mouth daily.   Current Outpatient Medications (Other):    carisoprodol (SOMA) 350 MG tablet, Take 1 tablet (350 mg total) by mouth 3 (three) times daily as needed for muscle spasms.   clonazePAM (  KLONOPIN) 0.5 MG tablet, clonazepam 0.5 mg tablet   cyclobenzaprine (FLEXERIL) 5 MG tablet, Take 1 tablet (5 mg total) by mouth at bedtime as needed for muscle spasms.   diazepam (VALIUM) 10 MG tablet, Take 1 tablet (10 mg total) by mouth every 12 (twelve) hours as needed. One tab by mouth, 2 hours before procedure.   doxycycline (VIBRA-TABS) 100 MG tablet, Take 1 tablet (100 mg total) by mouth 2 (two) times daily.   gabapentin (NEURONTIN) 300 MG capsule, Take 1 capsule by mouth at bedtime   lamoTRIgine (LAMICTAL XR) 100 MG 24 hour tablet, Take 100 mg by mouth daily.   Testosterone 20 % CREA, Compounded  Testosterone Cream/Gel 2%  Testosterone Proprionate 2 mg/gram in gel or cream base  use small pea sized amount as directed once a week   Reviewed prior external information including notes and imaging from  primary care provider As well as notes that were available from care everywhere and other healthcare systems.  Past medical history, social, surgical and family history all reviewed in electronic medical record.  No pertanent information unless stated regarding to the chief complaint.   Review of Systems:  No headache, visual changes, nausea, vomiting, diarrhea, constipation, dizziness, abdominal pain, skin rash, fevers, chills, night sweats, weight loss, swollen lymph nodes, body aches, joint swelling, chest pain, shortness of breath, mood changes. POSITIVE muscle aches  Objective  Blood pressure 122/84, pulse 71, height 5\' 6"  (1.676 m), weight 139 lb (63 kg), SpO2 99 %.   General: No apparent distress alert and oriented x3 mood and affect normal, dressed appropriately.  HEENT: Pupils equal, extraocular movements intact  Respiratory: Patient's speak in full sentences and does not appear short of breath  Cardiovascular: No lower extremity edema, non tender, no erythema  Left elbow does have some tenderness to palpation over the olecranon area.  Does have some just mild fullness noted in the area.  The patient does have pain with resisted extension of the wrist at the elbow as well. Left shoulder does have positive impingement noted.  Positive crossover noted.  5 out of 5 strength of the rotator cuff.  Limited muscular skeletal ultrasound was performed and interpreted by Antoine Primas, M  Limited ultrasound of patient's clavicular joint does have some hypoechoic changes still noted in the area.  Rotator cuff appears to be unremarkable.  Procedure: Real-time Ultrasound Guided Injection of left acromioclavicular joint Device: GE Logiq Q7 Ultrasound guided injection is preferred based  studies that show increased duration, increased effect, greater accuracy, decreased procedural pain, increased response rate, and decreased cost with ultrasound guided versus blind injection.  Verbal informed consent obtained.  Time-out conducted.  Noted no overlying erythema, induration, or other signs of local infection.  Skin prepped in a sterile fashion.  Local anesthesia: Topical Ethyl chloride.  With sterile technique and under real time ultrasound guidance: With a 25-gauge half inch needle injected with 0.5 cc of 0.5% Marcaine and 0.5 cc of Kenalog 40 mg/mL Completed without difficulty  Pain immediately resolved suggesting accurate placement of the medication.  Advised to call if fevers/chills, erythema, induration, drainage, or persistent bleeding.  Impression: Technically successful ultrasound guided injection.  Regarding patient's elbow does appear double calcific line that can be consistent with possible acid distributions.   Osteopathic findings  C6 flexed rotated and side bent left T3 extended rotated and side bent right inhaled third rib T9 extended rotated and side bent left L1 flexed rotated and side bent right Sacrum right on  right    Impression and Recommendations:      The above documentation has been reviewed and is accurate and complete Judi Saa, DO

## 2023-03-08 ENCOUNTER — Encounter: Payer: Self-pay | Admitting: Family Medicine

## 2023-03-08 ENCOUNTER — Other Ambulatory Visit: Payer: Self-pay

## 2023-03-08 ENCOUNTER — Ambulatory Visit: Payer: BC Managed Care – PPO | Admitting: Family Medicine

## 2023-03-08 ENCOUNTER — Ambulatory Visit: Payer: 59 | Admitting: Physician Assistant

## 2023-03-08 VITALS — BP 122/84 | HR 71 | Ht 66.0 in | Wt 139.0 lb

## 2023-03-08 DIAGNOSIS — M9901 Segmental and somatic dysfunction of cervical region: Secondary | ICD-10-CM

## 2023-03-08 DIAGNOSIS — M9903 Segmental and somatic dysfunction of lumbar region: Secondary | ICD-10-CM

## 2023-03-08 DIAGNOSIS — M9902 Segmental and somatic dysfunction of thoracic region: Secondary | ICD-10-CM

## 2023-03-08 DIAGNOSIS — M999 Biomechanical lesion, unspecified: Secondary | ICD-10-CM | POA: Diagnosis not present

## 2023-03-08 DIAGNOSIS — M9904 Segmental and somatic dysfunction of sacral region: Secondary | ICD-10-CM | POA: Diagnosis not present

## 2023-03-08 DIAGNOSIS — M25522 Pain in left elbow: Secondary | ICD-10-CM

## 2023-03-08 DIAGNOSIS — M7712 Lateral epicondylitis, left elbow: Secondary | ICD-10-CM

## 2023-03-08 DIAGNOSIS — S43102D Unspecified dislocation of left acromioclavicular joint, subsequent encounter: Secondary | ICD-10-CM

## 2023-03-08 DIAGNOSIS — M9908 Segmental and somatic dysfunction of rib cage: Secondary | ICD-10-CM

## 2023-03-08 NOTE — Assessment & Plan Note (Signed)
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  Ultrasound does show that there is some calcific changes noted though.  We discussed icing regimen and home exercises, we discussed which activities to do and which ones to avoid.  Increase activity slowly.  Watch overhead lifting.  Worsening pain can consider injection but did not see anything specific today.

## 2023-03-08 NOTE — Assessment & Plan Note (Signed)
Patient given a repeat injection and tolerated the procedure well, discussed icing regimen and home exercises, discussed which activities to do and which ones to avoid, increase activity slowly.  Follow-up again in 6 to 8 weeks

## 2023-03-08 NOTE — Assessment & Plan Note (Signed)

## 2023-03-08 NOTE — Patient Instructions (Addendum)
Injection in [employer] joint today Do prescribed exercises at least 3x a week Avoid overhand lifting Tart [place] 2400mg  See you again in 6 weeks if needed

## 2023-04-14 NOTE — Progress Notes (Deleted)
  [facility] [address] [place] [address] Phone: [phone] Subjective:    I'm seeing this patient by the request  of:  Dois Davenport, MD  CC:   [id number]  Gina [name] is a 57 y.o. female coming in with complaint of back and neck pain. OMT 03/08/2023. L elbow pain. Patient states   Medications patient has been prescribed: Gabapentin, Advil  Taking:         Reviewed prior external information including notes and imaging from previsou exam, outside providers and external EMR if available.   As well as notes that were available from care everywhere and other healthcare systems.  Past medical history, social, surgical and family history all reviewed in electronic medical record.  No pertanent information unless stated regarding to the chief complaint.   Past Medical History:  Diagnosis Date   Acute meniscal tear of knee    Allergy    Atypical mole 01/02/2008   Right Mid Paraspinal (slight)   Atypical mole 01/16/2009   Left Mid Paraspinal (moderate)   Atypical mole 10/07/2009   Mid Spinal (moderate to severe) Nino Glow)   Migraine     No Known Allergies   Review of Systems:  No headache, visual changes, nausea, vomiting, diarrhea, constipation, dizziness, abdominal pain, skin rash, fevers, chills, night sweats, weight loss, swollen lymph nodes, body aches, joint swelling, chest pain, shortness of breath, mood changes. POSITIVE muscle aches  Objective  There were no vitals taken for this visit.   General: No apparent distress alert and oriented x3 mood and affect normal, dressed appropriately.  HEENT: Pupils equal, extraocular movements intact  Respiratory: Patient's speak in full sentences and does not appear short of breath  Cardiovascular: No lower extremity edema, non tender, no erythema  Gait MSK:  Back   Osteopathic findings  C2 flexed rotated and side bent right C6 flexed rotated and side bent  left T3 extended rotated and side bent right inhaled rib T9 extended rotated and side bent left L2 flexed rotated and side bent right Sacrum right on right       Assessment and Plan:  No problem-specific Assessment & Plan notes found for this encounter.    Nonallopathic problems  Decision today to treat with OMT was based on Physical Exam  After verbal consent patient was treated with HVLA, ME, FPR techniques in cervical, rib, thoracic, lumbar, and sacral  areas  Patient tolerated the procedure well with improvement in symptoms  Patient given exercises, stretches and lifestyle modifications  See medications in patient instructions if given  Patient will follow up in 4-8 weeks             Note: This dictation was prepared with Dragon dictation along with smaller phrase technology. Any transcriptional errors that result from this process are unintentional.

## 2023-04-18 DIAGNOSIS — M9901 Segmental and somatic dysfunction of cervical region: Secondary | ICD-10-CM | POA: Diagnosis not present

## 2023-04-18 DIAGNOSIS — M25562 Pain in left knee: Secondary | ICD-10-CM | POA: Diagnosis not present

## 2023-04-18 DIAGNOSIS — M25561 Pain in right knee: Secondary | ICD-10-CM | POA: Diagnosis not present

## 2023-04-18 DIAGNOSIS — M722 Plantar fascial fibromatosis: Secondary | ICD-10-CM | POA: Diagnosis not present

## 2023-04-19 ENCOUNTER — Ambulatory Visit: Payer: BC Managed Care – PPO | Admitting: Family Medicine

## 2023-04-19 ENCOUNTER — Other Ambulatory Visit: Payer: Self-pay | Admitting: Family Medicine

## 2023-04-22 DIAGNOSIS — M722 Plantar fascial fibromatosis: Secondary | ICD-10-CM | POA: Diagnosis not present

## 2023-04-22 DIAGNOSIS — M25561 Pain in right knee: Secondary | ICD-10-CM | POA: Diagnosis not present

## 2023-04-22 DIAGNOSIS — M9901 Segmental and somatic dysfunction of cervical region: Secondary | ICD-10-CM | POA: Diagnosis not present

## 2023-04-22 DIAGNOSIS — M25562 Pain in left knee: Secondary | ICD-10-CM | POA: Diagnosis not present

## 2023-04-25 DIAGNOSIS — M722 Plantar fascial fibromatosis: Secondary | ICD-10-CM | POA: Diagnosis not present

## 2023-04-25 DIAGNOSIS — M25561 Pain in right knee: Secondary | ICD-10-CM | POA: Diagnosis not present

## 2023-04-25 DIAGNOSIS — M25562 Pain in left knee: Secondary | ICD-10-CM | POA: Diagnosis not present

## 2023-04-25 DIAGNOSIS — M9901 Segmental and somatic dysfunction of cervical region: Secondary | ICD-10-CM | POA: Diagnosis not present

## 2023-04-27 DIAGNOSIS — M9901 Segmental and somatic dysfunction of cervical region: Secondary | ICD-10-CM | POA: Diagnosis not present

## 2023-04-27 DIAGNOSIS — M25561 Pain in right knee: Secondary | ICD-10-CM | POA: Diagnosis not present

## 2023-04-27 DIAGNOSIS — M25562 Pain in left knee: Secondary | ICD-10-CM | POA: Diagnosis not present

## 2023-04-27 DIAGNOSIS — M722 Plantar fascial fibromatosis: Secondary | ICD-10-CM | POA: Diagnosis not present

## 2023-04-29 DIAGNOSIS — M25562 Pain in left knee: Secondary | ICD-10-CM | POA: Diagnosis not present

## 2023-04-29 DIAGNOSIS — M25561 Pain in right knee: Secondary | ICD-10-CM | POA: Diagnosis not present

## 2023-04-29 DIAGNOSIS — M722 Plantar fascial fibromatosis: Secondary | ICD-10-CM | POA: Diagnosis not present

## 2023-04-29 DIAGNOSIS — M9901 Segmental and somatic dysfunction of cervical region: Secondary | ICD-10-CM | POA: Diagnosis not present

## 2023-05-02 DIAGNOSIS — M25561 Pain in right knee: Secondary | ICD-10-CM | POA: Diagnosis not present

## 2023-05-02 DIAGNOSIS — M9901 Segmental and somatic dysfunction of cervical region: Secondary | ICD-10-CM | POA: Diagnosis not present

## 2023-05-02 DIAGNOSIS — M25562 Pain in left knee: Secondary | ICD-10-CM | POA: Diagnosis not present

## 2023-05-02 DIAGNOSIS — M722 Plantar fascial fibromatosis: Secondary | ICD-10-CM | POA: Diagnosis not present

## 2023-05-11 DIAGNOSIS — M25561 Pain in right knee: Secondary | ICD-10-CM | POA: Diagnosis not present

## 2023-05-11 DIAGNOSIS — M9901 Segmental and somatic dysfunction of cervical region: Secondary | ICD-10-CM | POA: Diagnosis not present

## 2023-05-11 DIAGNOSIS — M722 Plantar fascial fibromatosis: Secondary | ICD-10-CM | POA: Diagnosis not present

## 2023-05-11 DIAGNOSIS — M25562 Pain in left knee: Secondary | ICD-10-CM | POA: Diagnosis not present

## 2023-05-20 DIAGNOSIS — M25562 Pain in left knee: Secondary | ICD-10-CM | POA: Diagnosis not present

## 2023-05-20 DIAGNOSIS — M722 Plantar fascial fibromatosis: Secondary | ICD-10-CM | POA: Diagnosis not present

## 2023-05-20 DIAGNOSIS — M9901 Segmental and somatic dysfunction of cervical region: Secondary | ICD-10-CM | POA: Diagnosis not present

## 2023-05-20 DIAGNOSIS — M25561 Pain in right knee: Secondary | ICD-10-CM | POA: Diagnosis not present

## 2023-06-12 ENCOUNTER — Other Ambulatory Visit: Payer: Self-pay | Admitting: Family Medicine

## 2023-08-21 ENCOUNTER — Other Ambulatory Visit: Payer: Self-pay | Admitting: Family Medicine

## 2023-10-22 ENCOUNTER — Other Ambulatory Visit: Payer: Self-pay | Admitting: Family Medicine

## 2023-11-16 ENCOUNTER — Encounter: Payer: Self-pay | Admitting: Family Medicine

## 2023-11-16 ENCOUNTER — Ambulatory Visit: Payer: BC Managed Care – PPO | Admitting: Family Medicine

## 2023-11-16 ENCOUNTER — Other Ambulatory Visit: Payer: Self-pay | Admitting: Family Medicine

## 2023-11-16 VITALS — BP 158/100 | HR 74 | Temp 98.2°F | Ht 66.0 in | Wt 135.0 lb

## 2023-11-16 DIAGNOSIS — Z0001 Encounter for general adult medical examination with abnormal findings: Secondary | ICD-10-CM

## 2023-11-16 DIAGNOSIS — Z23 Encounter for immunization: Secondary | ICD-10-CM

## 2023-11-16 DIAGNOSIS — G4709 Other insomnia: Secondary | ICD-10-CM

## 2023-11-16 DIAGNOSIS — Z79890 Hormone replacement therapy (postmenopausal): Secondary | ICD-10-CM

## 2023-11-16 DIAGNOSIS — Z8669 Personal history of other diseases of the nervous system and sense organs: Secondary | ICD-10-CM

## 2023-11-16 DIAGNOSIS — Z8659 Personal history of other mental and behavioral disorders: Secondary | ICD-10-CM

## 2023-11-16 DIAGNOSIS — R030 Elevated blood-pressure reading, without diagnosis of hypertension: Secondary | ICD-10-CM

## 2023-11-16 DIAGNOSIS — Z13220 Encounter for screening for lipoid disorders: Secondary | ICD-10-CM

## 2023-11-16 LAB — COMPREHENSIVE METABOLIC PANEL
ALT: 13 U/L (ref 0–35)
AST: 20 U/L (ref 0–37)
Albumin: 4.8 g/dL (ref 3.5–5.2)
Alkaline Phosphatase: 40 U/L (ref 39–117)
BUN: 10 mg/dL (ref 6–23)
CO2: 28 meq/L (ref 19–32)
Calcium: 9.5 mg/dL (ref 8.4–10.5)
Chloride: 100 meq/L (ref 96–112)
Creatinine, Ser: 0.98 mg/dL (ref 0.40–1.20)
GFR: 64.95 mL/min (ref >60.00)
Glucose, Bld: 95 mg/dL (ref 70–99)
Potassium: 4.1 meq/L (ref 3.5–5.1)
Sodium: 136 meq/L (ref 135–145)
Total Bilirubin: 0.7 mg/dL (ref 0.2–1.2)
Total Protein: 7.3 g/dL (ref 6.0–8.3)

## 2023-11-16 LAB — CBC WITH DIFFERENTIAL/PLATELET
Basophils Absolute: 0 10*3/uL (ref 0.0–0.1)
Basophils Relative: 0.2 % (ref 0.0–3.0)
Eosinophils Absolute: 0 10*3/uL (ref 0.0–0.7)
Eosinophils Relative: 0.6 % (ref 0.0–5.0)
HCT: 41 % (ref 36.0–46.0)
Hemoglobin: 14 g/dL (ref 12.0–15.0)
Lymphocytes Relative: 23.6 % (ref 12.0–46.0)
Lymphs Abs: 1.4 10*3/uL (ref 0.7–4.0)
MCHC: 34.1 g/dL (ref 30.0–36.0)
MCV: 97 fL (ref 78.0–100.0)
Monocytes Absolute: 0.5 10*3/uL (ref 0.1–1.0)
Monocytes Relative: 8.7 % (ref 3.0–12.0)
Neutro Abs: 3.9 10*3/uL (ref 1.4–7.7)
Neutrophils Relative %: 66.9 % (ref 43.0–77.0)
Platelets: 368 10*3/uL (ref 150.0–400.0)
RBC: 4.23 Mil/uL (ref 3.87–5.11)
RDW: 12.7 % (ref 11.5–15.5)
WBC: 5.8 10*3/uL (ref 4.0–10.5)

## 2023-11-16 LAB — LIPID PANEL
Cholesterol: 177 mg/dL (ref 0–200)
HDL: 80.9 mg/dL (ref >39.00)
LDL Cholesterol: 87 mg/dL (ref 0–99)
NonHDL: 96.49
Total CHOL/HDL Ratio: 2
Triglycerides: 45 mg/dL (ref 0.0–149.0)
VLDL: 9 mg/dL (ref 0.0–40.0)

## 2023-11-16 LAB — TSH: TSH: 1.15 u[IU]/mL (ref 0.35–5.50)

## 2023-11-16 LAB — VITAMIN D 25 HYDROXY (VIT D DEFICIENCY, FRACTURES): VITD: 79.36 ng/mL (ref 30.00–100.00)

## 2023-11-16 NOTE — Patient Instructions (Addendum)
 Please return in 12 months for your annual complete physical; please come fasting.   I will release your lab results to you on your MyChart account with further instructions. You may see the results before I do, but when I review them I will send you a message with my report or have my assistant call you if things need to be discussed. Please reply to my message with any questions. Thank you!   It was a pleasure meeting you today! Thank you for choosing us  to meet your healthcare needs! I truly look forward to working with you. If you have any questions or concerns, please send me a message via Mychart or call the office at [phone].   VISIT SUMMARY:  Today, you had your first visit with us  as your new primary care provider. We discussed your overall health, reviewed your medical history, and planned for routine health maintenance. We also addressed specific concerns including your anxiety, blood pressure, foot pain, and need for dermatology care.  YOUR PLAN:  -GENERAL HEALTH MAINTENANCE: We will perform a physical exam and order basic blood work to check your kidney, liver, thyroid, blood counts, and cholesterol levels. We will also check your vitamin D  levels and administer a tetanus and pertussis booster. Your blood pressure will be rechecked at the end of the visit, and you are advised to monitor your blood pressure at home and send us  the readings.  -WHITE COAT HYPERTENSION: White coat hypertension is when your blood pressure is elevated in a clinical setting but normal at other times. Your blood pressure was high during the visit, so we recommend monitoring it at home and sending us  the readings. We will document this in your medical record.  -ANXIETY AND INSOMNIA: Your anxiety is currently managed with Klonopin , which you take twice a week at night for sleep. This regimen is effective for you. We discussed the potential risks of long-term use, including memory issues. If you decide not to  return to your previous psychiatrist, we can continue to prescribe Klonopin  for you.  -ORTHOTIC-RELATED FOOT PAIN: You have high arches and experience pain in your shins and the ball of your feet with your current orthotics. We will ask Dr. Zach Smith for recommendations for a new podiatrist or orthotic specialist. If he cannot provide a recommendation, we may refer you to sports medicine or family practice for an orthotic evaluation.  -DERMATOLOGY CARE: You need a new dermatologist for routine skincare. We recommend [facility] for your skincare needs.  -FAMILY HISTORY OF CANCER AND CARDIOVASCULAR DISEASE: You have a family history of prostate cancer, breast cancer, hypertension, high cholesterol, and congestive heart failure. We will continue routine screenings and preventive measures as per guidelines.  INSTRUCTIONS:  Please monitor your blood pressure at home and send us  the readings. We will review your blood work results and communicate the findings via MyChart. Schedule your annual physical exam and contact us  if you have any immediate health concerns.

## 2023-11-16 NOTE — Progress Notes (Signed)
 Subjective  Chief Complaint  Patient presents with   Establish Care    HPI: Gina [name] is a 58 y.o. female who presents to [facility] [place] today for a Female Wellness Visit. She also has the concerns and/or needs as listed above in the chief complaint. These will be addressed in addition to the Health Maintenance Visit.   Wellness Visit: annual visit with health maintenance review and exam  HM: sees GYN; mammo and pap are current. Pap 11/2022 mammo due now. Nl colonoscopy w/ Dr. Kristie, current. Declines flu and shingrix vaccinations, education given. Tdap due.  Chronic disease f/u and/or acute problem visit: (deemed necessary to be done in addition to the wellness visit): Discussed the use of AI scribe software for clinical note transcription with the patient, who gave verbal consent to proceed.  History of Present Illness   The patient, a emergency planning/management officer at [employer], presents for a new primary care consultation after a transition from a previous provider. The patient has a history of anxiety, managed with Klonopin  as needed, primarily for sleep issues. The patient reports taking a quarter to half a mg of Klonopin  approximately twice a week at night when unable to sleep. The patient also takes various supplements for mood stabilization and has been stable without the need for SSRIs or other mood stabilizers. Has failed trazadone. No h/o GAD but h/o PMDD> no panic d/o. Never treated or needed daily meds. Sleep is main issue and does well on melatonin, gabapentin  otc supplement and prn klonopin .  The patient has a history of migraines, but reports that this is no longer an issue. The patient also has a history of wearing orthotics due to high arches and is seeking a new podiatrist after dissatisfaction with previous orthotics made by [facility]. The patient also expresses a need for a new dermatologist for routine skincare.  The patient has a history of mood  changes associated with menstrual cycles, but these have been managed without the need for medication. The patient also reports a history of strep throat until the age of 65-19, which resolved after seeing an integrative practitioner and clearing the viral load.  The patient has a family history of prostate cancer in the father, brother, and maternal grandfather, and breast cancer in the maternal grandmother. The patient's father and brother also have a history of high blood pressure and high cholesterol. The patient's paternal grandfather had congestive heart failure.  The patient leads an active lifestyle, walking the dog regularly, and has a healthy diet with no restrictions. The patient has not had any recent blood work and is due for a physical examination. The patient also reports a need for a tetanus shot and vitamin D  check. The patient has not had any recent immunizations and declines the shingles vaccination.  The patient reports no other chronic medical problems or acute issues at this time. The patient's goal is to have a once-a-year physical and a place to go if something comes up. The patient also reports occasional high blood pressure readings in the office, which may be due to white coat hypertension. The patient will monitor blood pressure outside the office and report back.       Assessment  1. Encounter for well adult exam with abnormal findings   2. White coat syndrome without diagnosis of hypertension   3. History of migraine headaches   4. Secondary insomnia   5. History of anxiety   6. Hormone replacement  therapy (HRT)   7. Need for Tdap vaccination      Plan  Female Wellness Visit: Age appropriate Health Maintenance and Prevention measures were discussed with patient. Included topics are cancer screening recommendations, ways to keep healthy (see AVS) including dietary and exercise recommendations, regular eye and dental care, use of seat belts, and avoidance of moderate  alcohol use and tobacco use.  BMI: discussed patient's BMI and encouraged positive lifestyle modifications to help get to or maintain a target BMI. HM needs and immunizations were addressed and ordered. See below for orders. See HM and immunization section for updates. Routine labs and screening tests ordered including cmp, cbc and lipids where appropriate. Discussed recommendations regarding Vit D and calcium supplementation (see AVS)  Chronic disease management visit and/or acute problem visit: Assessment and Plan    General Health Maintenance New primary care visit and physical exam. No blood work since 2022. Generally healthy with no significant chronic conditions. Declined shingles vaccination but agreed to tetanus and pertussis booster after discussion of benefits and risks. - Order basic blood work including kidney, liver, thyroid, blood counts, and cholesterol - Check vitamin D  levels - Administer tetanus and pertussis booster - Advise home blood pressure monitoring and send readings  White coat hypertension Blood pressure elevated at 160/90 and 190/90 during the visit. History of elevated readings in clinical settings but normal otherwise. Discussed white coat hypertension and advised home monitoring. - Advise home blood pressure monitoring and send readings - Document white coat hypertension without hypertension in medical record  Anxiety related insomnia Managed with Klonopin  (0.25-0.5 mg) taken twice a week at night for sleep since 2015 and gabapentin . Reports regimen is effective. Aware of risks associated with long-term use, including potential memory issues. Prefers not to use SSRIs or other prescription sleep aids. - Continue current Klonopin  regimen - Discuss potential risks of long-term Klonopin  use, including memory issues - Offer to prescribe Klonopin  if she decides not to return to her previous psychiatrist  Orthotic-related foot pain High arches with pain in shins  and ball of feet using current orthotics. Previous orthotics from [facility] were unsatisfactory. Discussed potential referral options including sports medicine and family practice. - Ask Dr. Zach Smith for recommendations for a new podiatrist or orthotic specialist - Consider referral to sports medicine or family practice for orthotic evaluation if Dr. Claudene cannot provide a recommendation  Dermatology care Needs a new dermatologist for routine skincare as previous dermatologist is no longer available. - Recommend [facility] for routine skincare needs  Family history of cancer and cardiovascular disease Family history of prostate cancer (father, brother, maternal grandfather), breast cancer (maternal grandmother), hypertension (father, brother), hypercholesterolemia (father, brother), and congestive heart failure (paternal grandfather). - Continue routine screenings and preventive measures as per guidelines  Follow-up - Review blood work results and communicate findings via MyChart - Schedule annual physical exam - Provide contact information for any immediate health concerns.       Follow up: 12 mo for cpe  Orders Placed This Encounter  Procedures   Tdap vaccine greater than or equal to 7yo IM   VITAMIN D  25 Hydroxy (Vit-D Deficiency, Fractures)   CBC with Differential/Platelet   Comprehensive metabolic panel   Lipid panel   TSH   No orders of the defined types were placed in this encounter.     Body mass index is 21.79 kg/m. Wt Readings from Last 3 Encounters:  11/16/23 135 lb (61.2 kg)  03/08/23 139 lb (  63 kg)  09/14/22 138 lb (62.6 kg)     Patient Active Problem List   Diagnosis Date Noted Date Diagnosed   History of migraine headaches 11/16/2023    White coat syndrome without diagnosis of hypertension 11/16/2023    Secondary insomnia 11/16/2023     Gabapentin , melatonin, prn klonopin  Failed trazodone in past    History of anxiety  11/16/2023    Hormone replacement therapy (HRT) 11/16/2023    Menopausal syndrome 11/23/2022    Iron deficiency anemia 01/09/2018    Health Maintenance  Topic Date Due   HIV Screening  Never done   Hepatitis C Screening  Never done   COVID-19 Vaccine (1) 12/02/2023 (Originally 04/17/1971)   INFLUENZA VACCINE  02/06/2024 (Originally 06/09/2023)   Zoster Vaccines- Shingrix (1 of 2) 02/14/2024 (Originally 04/18/1987)   DTaP/Tdap/Td (1 - Tdap) 11/15/2024 (Originally 04/18/1987)   MAMMOGRAM  12/02/2023   Cervical Cancer Screening (HPV/Pap Cotest)  12/02/2027   Colonoscopy  10/23/2028   HPV VACCINES  Aged Out   There is no immunization history for the selected administration types on file for this patient. We updated and reviewed the patient's past history in detail and it is documented below. Allergies: Patient has no known allergies. Past Medical History Patient  has a past medical history of Acromioclavicular separation, type 1 (12/19/2012), Acute meniscal tear of knee, Allergy, Anxiety, Atypical mole (01/02/2008), Atypical mole (01/16/2009), Atypical mole (10/07/2009), Depression, and Migraine. Past Surgical History Patient  has a past surgical history that includes Tonsillectomy. Family History: Patient family history includes Alcohol abuse in her brother; Cancer in her brother, father, maternal grandfather, and maternal grandmother; Hyperlipidemia in her father and paternal grandfather; Hypertension in her daughter and father. Social History:  Patient  reports that she has never smoked. She has never used smokeless tobacco. She reports current alcohol use of about 4.0 standard drinks of alcohol per week. She reports that she does not use drugs.  Review of Systems: Constitutional: negative for fever or malaise Ophthalmic: negative for photophobia, double vision or loss of vision Cardiovascular: negative for chest pain, dyspnea on exertion, or new LE swelling Respiratory: negative for SOB  or persistent cough Gastrointestinal: negative for abdominal pain, change in bowel habits or melena Genitourinary: negative for dysuria or gross hematuria, no abnormal uterine bleeding or disharge Musculoskeletal: negative for new gait disturbance or muscular weakness Integumentary: negative for new or persistent rashes, no breast lumps Neurological: negative for TIA or stroke symptoms Psychiatric: negative for SI or delusions Allergic/Immunologic: negative for hives  Patient Care Team    Relationship Specialty Notifications Start End  Jodie Lavern CROME, MD PCP - General Family Medicine  11/16/23   Porter Andrez SAUNDERS, PA-C Physician Assistant Dermatology  07/31/20   Sarrah Browning, MD Consulting Physician Obstetrics and Gynecology  11/16/23   Cleotilde Sewer, OD  Optometry  11/16/23   Kristie Lamprey, MD Consulting Physician Gastroenterology  11/16/23   Claudene Arthea HERO, DO Consulting Physician Family Medicine  11/16/23   Mottinger, Browning DEL, DMD  Dentistry  11/16/23   Clarita Goltz Physical Therapy Assistant   11/16/23     Objective  Vitals: BP (!) 158/100   Pulse 74   Temp 98.2 F (36.8 C)   Ht 5' 6 (1.676 m)   Wt 135 lb (61.2 kg)   SpO2 99%   BMI 21.79 kg/m  General:  Well developed, well nourished, no acute distress  Psych:  Alert and orientedx3,normal mood and affect HEENT:  Normocephalic, atraumatic, non-icteric sclera,  supple neck without adenopathy, mass or thyromegaly Cardiovascular:  Normal S1, S2, RRR without gallop, rub or murmur Respiratory:  Good breath sounds bilaterally, CTAB with normal respiratory effort Gastrointestinal: normal bowel sounds, soft, non-tender, no noted masses. No HSM MSK: extremities without edema, joints without erythema or swelling Neurologic:    Mental status is normal.  Gross motor and sensory exams are normal.  No tremor  Commons side effects, risks, benefits, and alternatives for medications and treatment plan prescribed today were discussed, and the  patient expressed understanding of the given instructions. Patient is instructed to call or message via MyChart if he/she has any questions or concerns regarding our treatment plan. No barriers to understanding were identified. We discussed Red Flag symptoms and signs in detail. Patient expressed understanding regarding what to do in case of urgent or emergency type symptoms.  Medication list was reconciled, printed and provided to the patient in AVS. Patient instructions and summary information was reviewed with the patient as documented in the AVS. This note was prepared with assistance of Dragon voice recognition software. Occasional wrong-word or sound-a-like substitutions may have occurred due to the inherent limitations of voice recognition software

## 2023-11-17 ENCOUNTER — Encounter: Payer: Self-pay | Admitting: Family Medicine

## 2023-11-17 NOTE — Progress Notes (Signed)
 See mychart note Dear Ms. Goold, It was a pleasure meeting you! I have reviewed all of your lab results and am happy to report that they are all literally perfect!! :) Stay healthy and call me if you need me.  Sincerely, Dr. Mardelle Matte

## 2023-11-29 DIAGNOSIS — Z01419 Encounter for gynecological examination (general) (routine) without abnormal findings: Secondary | ICD-10-CM | POA: Diagnosis not present

## 2023-11-29 DIAGNOSIS — Z1231 Encounter for screening mammogram for malignant neoplasm of breast: Secondary | ICD-10-CM | POA: Diagnosis not present

## 2023-11-29 DIAGNOSIS — Z124 Encounter for screening for malignant neoplasm of cervix: Secondary | ICD-10-CM | POA: Diagnosis not present

## 2023-12-13 NOTE — Progress Notes (Signed)
 [name] [address] [place] [address] Phone: [phone] Subjective:   ISusannah Gully, am serving as a scribe for Dr. Arthea Claudene.  I'm seeing this patient by the request  of:  Jodie Lavern CROME, MD  CC: Foot pain  YEP:Dlagzrupcz  03/08/2023 Patient given a repeat injection and tolerated the procedure well, discussed icing regimen and home exercises, discussed which activities to do and which ones to avoid, increase activity slowly.  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  Ultrasound does show that there is some calcific changes noted though.  We discussed icing regimen and home exercises, we discussed which activities to do and which ones to avoid.  Increase activity slowly.  Watch overhead lifting.  Worsening pain can consider injection but did not see anything specific today.      Updated 12/16/2023 Mairin Lindsley is a 58 y.o. female coming in with complaint of feet and lower leg pain. In the last 6 months dull pain in shins and feet all the time. Sometimes the pain can wake her at night. Chiropractor did some scraping.       Past Medical History:  Diagnosis Date   Acromioclavicular separation, type 1 12/19/2012   Injected in May 21, 2019 repeat injection December 05, 2019 repeat injection given Mar 10, 2021 repeat injection given March 08, 2023     Acute meniscal tear of knee    Allergy    Anxiety    Atypical mole 01/02/2008   Right Mid Paraspinal (slight)   Atypical mole 01/16/2009   Left Mid Paraspinal (moderate)   Atypical mole 10/07/2009   Mid Spinal (moderate to severe) (widershave)   Insomnia    Migraine    PMDD (premenstrual dysphoric disorder)    Past Surgical History:  Procedure Laterality Date   TONSILLECTOMY     Social History   Socioeconomic History   Marital status: Single    Spouse name: Not on file   Number of children: Not on file   Years  of education: Not on file   Highest education level: Not on file  Occupational History   Not on file  Tobacco Use   Smoking status: Never   Smokeless tobacco: Never  Substance and Sexual Activity   Alcohol use: Yes    Alcohol/week: 4.0 standard drinks of alcohol    Types: 2 Cans of beer, 2 Shots of liquor per week   Drug use: No   Sexual activity: Yes    Birth control/protection: None    Comment: with females  Other Topics Concern   Not on file  Social History Narrative   Not on file   Social Drivers of Health   Financial Resource Strain: Not on file  Food Insecurity: Not on file  Transportation Needs: Not on file  Physical Activity: Not on file  Stress: Not on file  Social Connections: Not on file   No Known Allergies Family History  Problem Relation Age of Onset   Hyperlipidemia Father    Hypertension Father    Prostate cancer Father    Prostate cancer Brother    Alcohol abuse Brother    Hypertension Daughter    Breast cancer Maternal Grandmother    Varicose Veins Maternal Grandfather    Hyperlipidemia Paternal Grandfather     Current Outpatient Medications (Endocrine & Metabolic):    norethindrone-ethinyl estradiol (JINTELI) 1-5 MG-MCG TABS tablet,  Take by mouth daily.    Current Outpatient Medications (Analgesics):    ibuprofen  (ADVIL ) 600 MG tablet, TAKE 1 TABLET BY MOUTH EVERY 8 HOURS AS NEEDED   Current Outpatient Medications (Other):    Ascorbic Acid (VITAMIN C) 100 MG tablet, Take 100 mg by mouth daily.   Cholecalciferol (VITAMIN D3) 1.25 MG (50000 UT) CAPS, Take by mouth.   clonazePAM  (KLONOPIN ) 0.5 MG tablet, clonazepam  0.5 mg tablet   gabapentin  (NEURONTIN ) 300 MG capsule, Take 1 capsule by mouth at bedtime   magnesium 30 MG tablet, Take 30 mg by mouth 2 (two) times daily.   Multiple Vitamin (MULTIVITAMIN ADULT PO),    St Johns Wort 150 MG CAPS, Take by mouth.   Testosterone  20 % CREA, Compounded Testosterone  Cream/Gel 2%  Testosterone   Proprionate 2 mg/gram in gel or cream base  use small pea sized amount as directed once a week   Objective  Blood pressure 138/82, pulse 72, height 5' 6 (1.676 m), weight 133 lb (60.3 kg), SpO2 97%.   General: No apparent distress alert and oriented x3 mood and affect normal, dressed appropriately.  HEENT: Pupils equal, extraocular movements intact  Respiratory: Patient's speak in full sentences and does not appear short of breath  Cardiovascular: No lower extremity edema, non tender, no erythema   Foot exam shows that there is breakdown of the transverse arch bilaterally.  Does have some tightness noted of the posterior aspect of the ankle.  Mild overpronation of the hindfoot.   Impression and Recommendations:    The above documentation has been reviewed and is accurate and complete Pascal Stiggers M Jazmyn Offner, DO

## 2023-12-16 ENCOUNTER — Other Ambulatory Visit: Payer: Self-pay

## 2023-12-16 ENCOUNTER — Ambulatory Visit: Payer: BC Managed Care – PPO | Admitting: Family Medicine

## 2023-12-16 ENCOUNTER — Encounter: Payer: Self-pay | Admitting: Family Medicine

## 2023-12-16 VITALS — BP 138/82 | HR 72 | Ht 66.0 in | Wt 133.0 lb

## 2023-12-16 DIAGNOSIS — M216X9 Other acquired deformities of unspecified foot: Secondary | ICD-10-CM

## 2023-12-16 DIAGNOSIS — M79671 Pain in right foot: Secondary | ICD-10-CM

## 2023-12-16 DIAGNOSIS — M79672 Pain in left foot: Secondary | ICD-10-CM

## 2023-12-16 DIAGNOSIS — G8929 Other chronic pain: Secondary | ICD-10-CM

## 2023-12-16 NOTE — Assessment & Plan Note (Signed)
 Patient is a difficulty with her feet previously.  Do think that breakdown is likely what is contributing to more of patient's leg pain.  Do not feel that further workup is necessary but new orthotics would be the most beneficial.  Patient's last ones were greater than 56 years old at this time.  Patient is doing a lot more activity as well where she is on her feet.  Patient will be referred to for orthotics that I think will be the most beneficial for her.

## 2023-12-16 NOTE — Patient Instructions (Signed)
See me when you need me 

## 2023-12-20 ENCOUNTER — Ambulatory Visit: Payer: BC Managed Care – PPO | Admitting: Family Medicine

## 2023-12-20 ENCOUNTER — Encounter: Payer: Self-pay | Admitting: Family Medicine

## 2023-12-20 VITALS — BP 132/80 | Ht 66.0 in | Wt 135.0 lb

## 2023-12-20 DIAGNOSIS — G8929 Other chronic pain: Secondary | ICD-10-CM

## 2023-12-20 DIAGNOSIS — M79671 Pain in right foot: Secondary | ICD-10-CM

## 2023-12-20 DIAGNOSIS — M79672 Pain in left foot: Secondary | ICD-10-CM

## 2023-12-20 NOTE — Progress Notes (Addendum)
PCP: Willow Ora, MD  Chief Complaint: Bilateral metatarsalgia Subjective:   HPI: Patient is a 58 y.o. female here for Bilateral metatarsalgia that she has been dealing with.  Patient had orthotics made approximately 14 years ago and notes that they have been working well since but did want to get new ones made.  Patient otherwise has no other concerns at this time.  Patient previously saw Dr. Katrinka Blazing who recommended her to get new orthotics.    Physical exam  Inspection of the bilateral feet shows well-defined arch with pes cavus.  There is notable crossover of the bilateral fifth digit over the fourth.  Transverse arch appears to be diminished.  There is some tenderness to palpation over the first and fourth metatarsals on the plantar side.  Assessment & Plan:  1. 1. Chronic pain of both feet (Primary) Patient was fitted for a: Fit 'n Run semi-rigid orthotic  The orthotic was heated, placed on the orthotic stand.  The patient was positioned in subtalar neutral position and 10 degrees of ankle dorsiflexion in a weight bearing stance on the heated orthotic blank  After completion of molding  Blank: Fit 'n Run - size 8 Posting:  Metatarsal bar on R and L orthotic Base: None  Brenton Grills MD, PGY-4  Sports Medicine Fellow [facility]

## 2023-12-21 ENCOUNTER — Ambulatory Visit: Payer: BC Managed Care – PPO | Admitting: Family Medicine

## 2024-01-07 IMAGING — DX DG HAND COMPLETE 3+V*R*
3 series · 3 of 3 positions shown · non-contrast
Comparison: None Available.

CLINICAL DATA: Right middle finger pain for 1 month.

EXAM:
RIGHT HAND - COMPLETE 3+ VIEW

[hand ap]
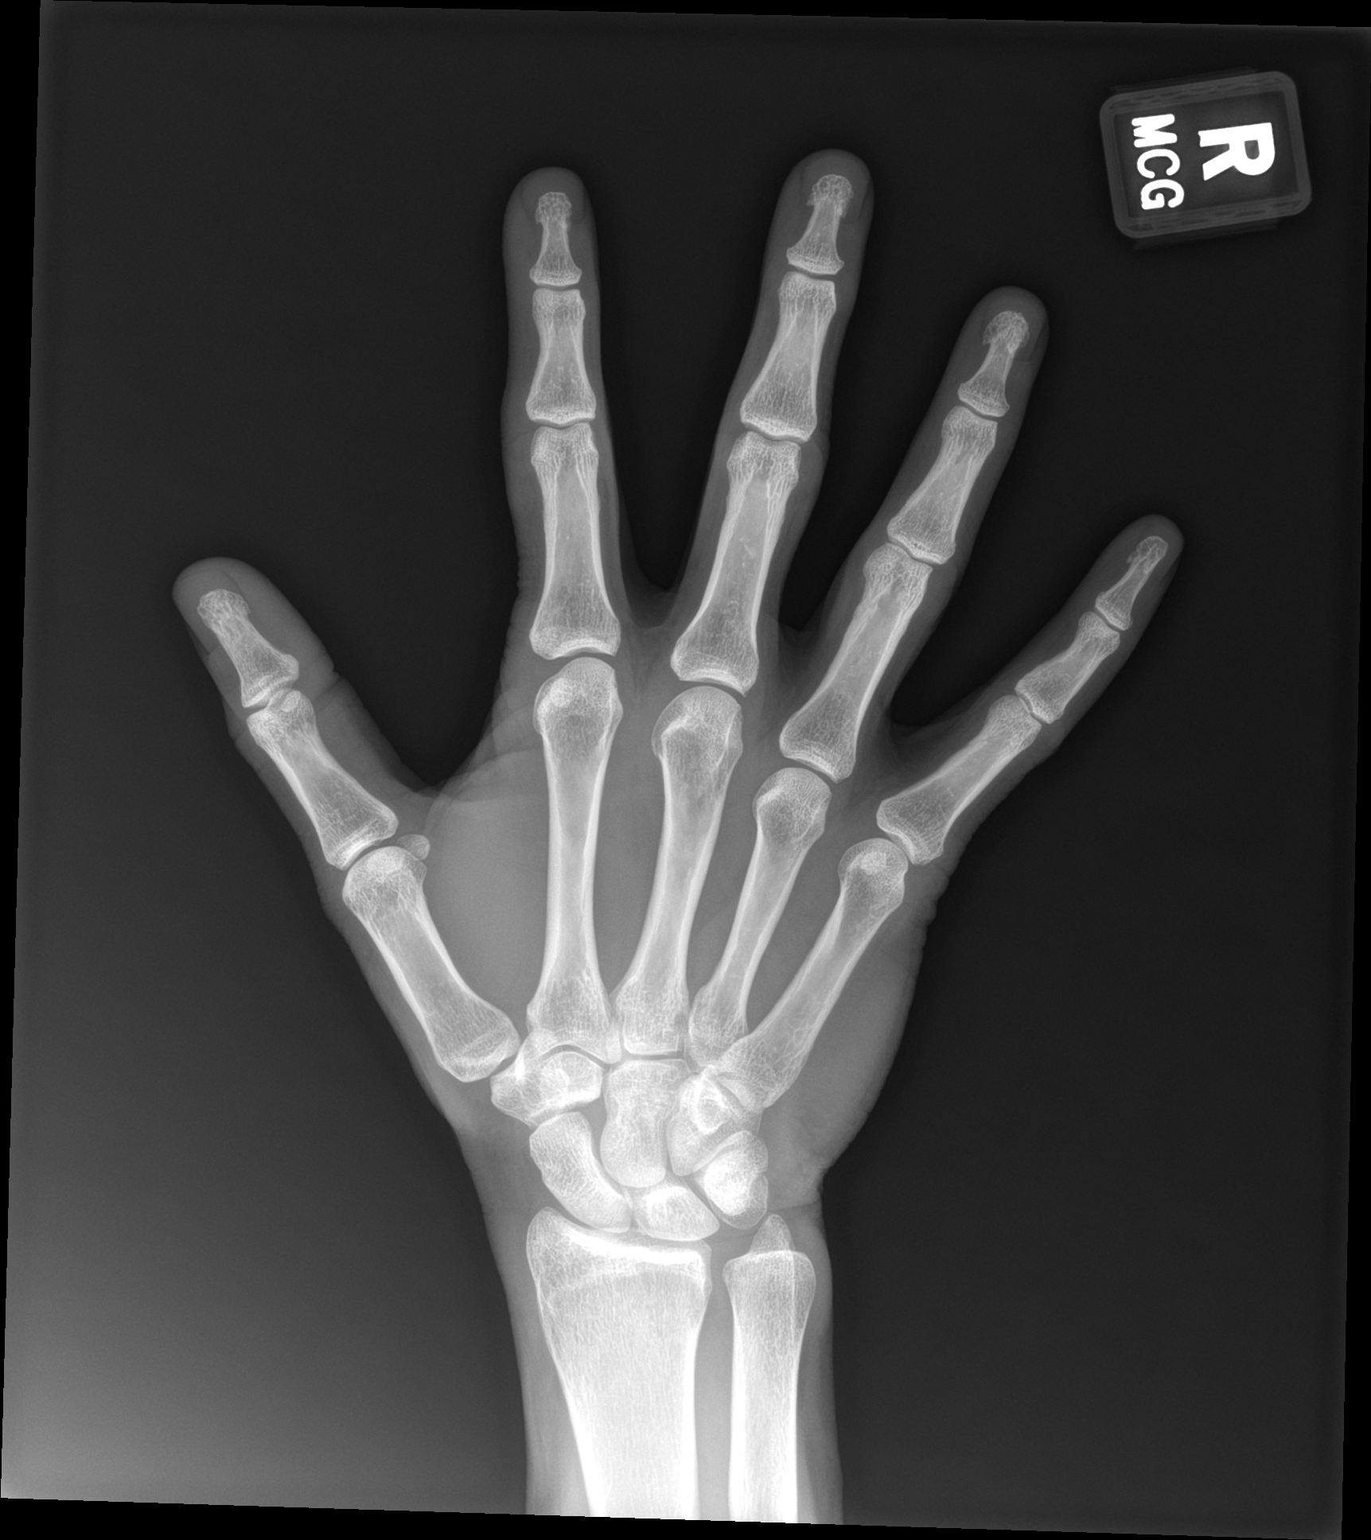

[hand obl]
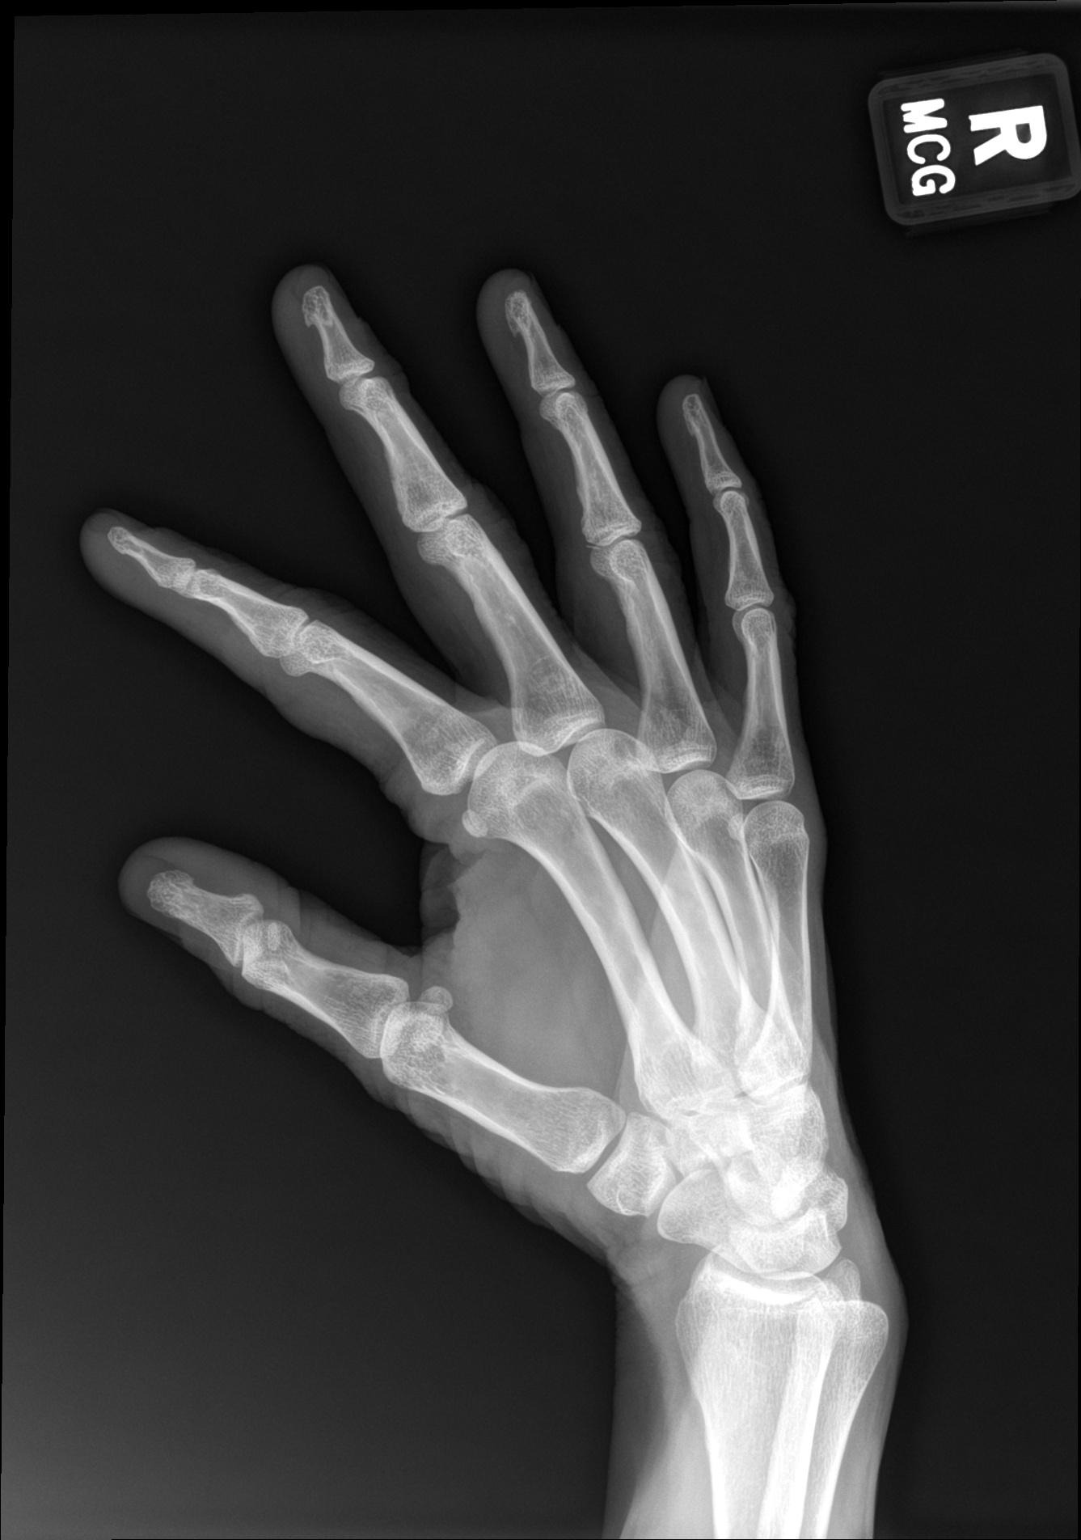

[hand lat]
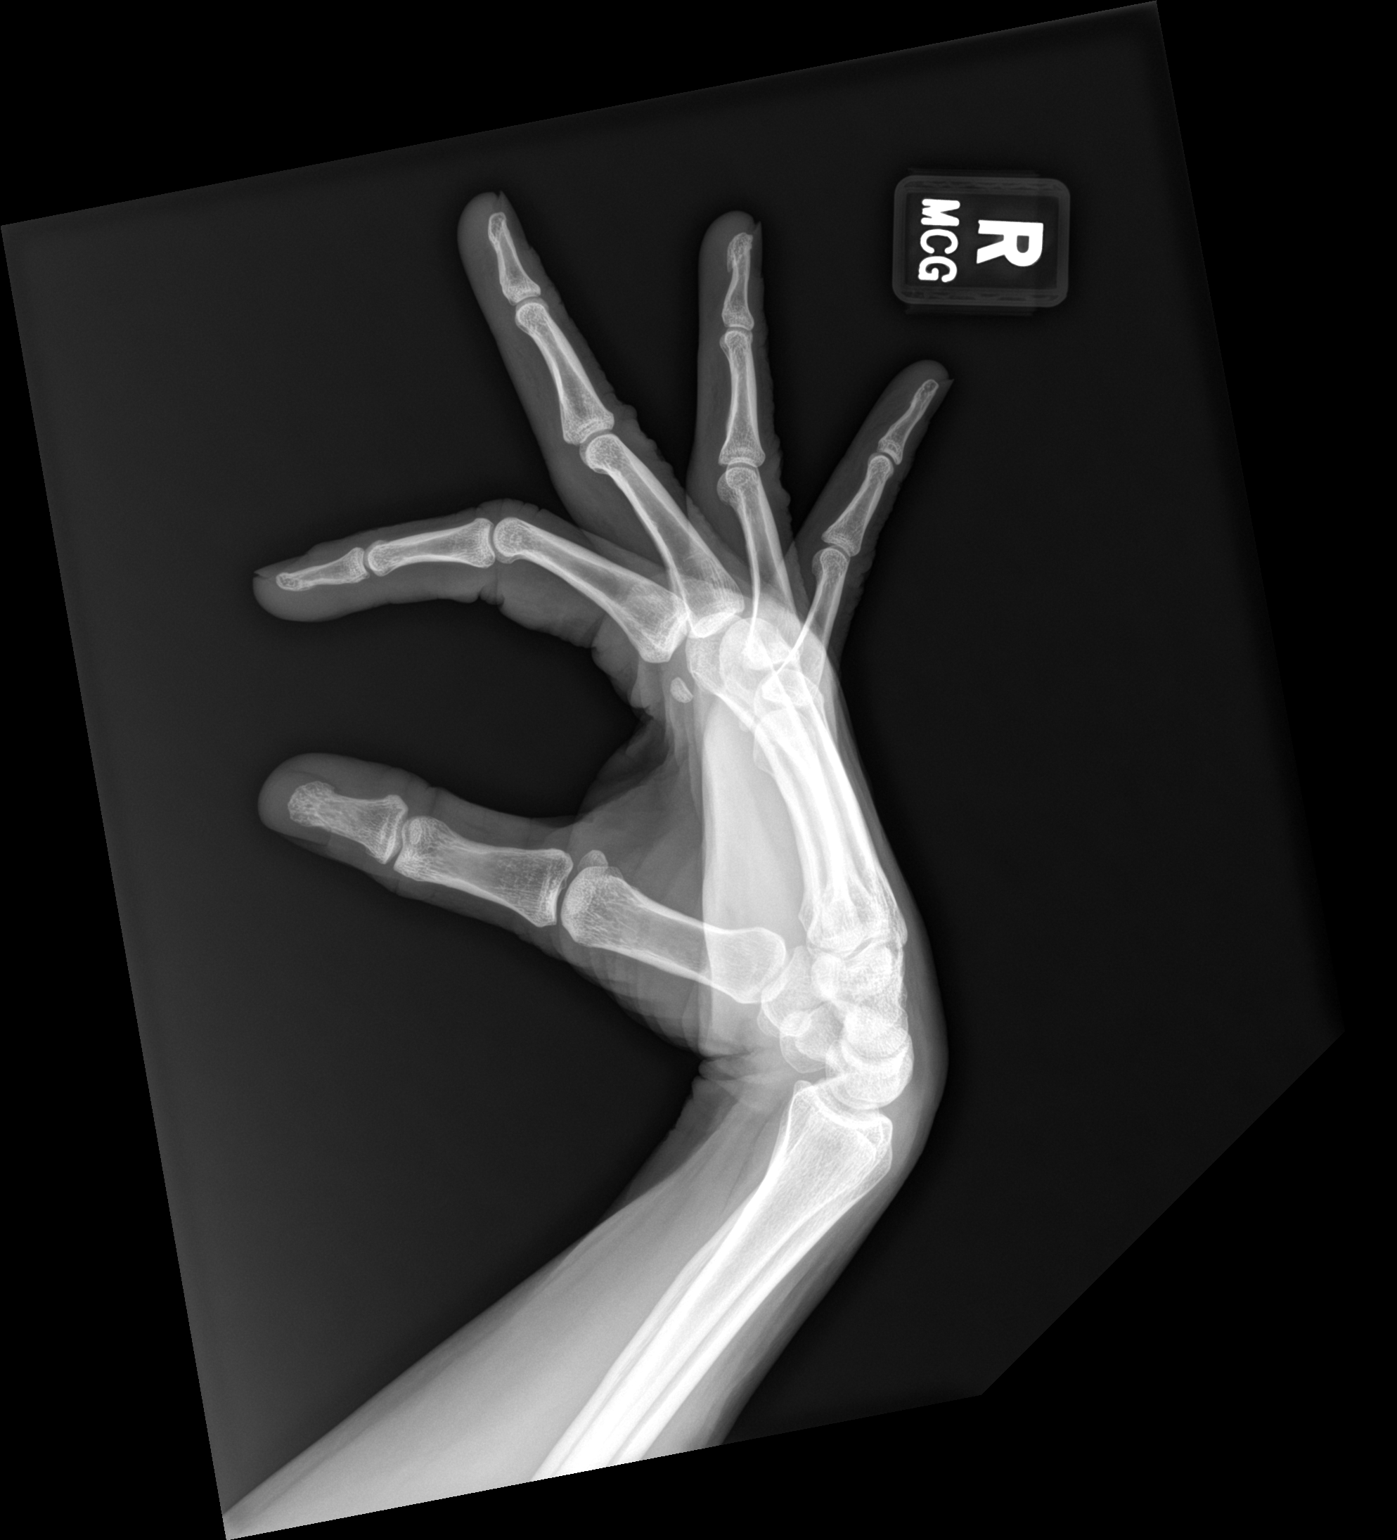

[3 of 3 positions shown; findings below may reference images not displayed]

FINDINGS: Normal bone mineralization. Joint spaces are preserved. No acute
fracture is seen. No dislocation.
IMPRESSION: No acute fracture.

## 2024-03-03 ENCOUNTER — Other Ambulatory Visit: Payer: Self-pay | Admitting: Family Medicine

## 2024-03-06 ENCOUNTER — Ambulatory Visit: Admitting: Family Medicine

## 2024-03-06 ENCOUNTER — Other Ambulatory Visit: Payer: Self-pay

## 2024-03-06 VITALS — BP 132/88 | HR 78 | Ht 66.0 in

## 2024-03-06 DIAGNOSIS — M79672 Pain in left foot: Secondary | ICD-10-CM

## 2024-03-06 DIAGNOSIS — M216X9 Other acquired deformities of unspecified foot: Secondary | ICD-10-CM | POA: Diagnosis not present

## 2024-03-06 DIAGNOSIS — M79671 Pain in right foot: Secondary | ICD-10-CM | POA: Diagnosis not present

## 2024-03-06 NOTE — Progress Notes (Unsigned)
 [name] [address] [place] [address] Phone: [phone] Subjective:   Gina [name], am serving as a scribe for Dr. Ronnell Coins.  I'm seeing this patient by the request  of:  Gina Saha, MD  CC: Bilateral foot pain, back pain  [id number]  12/16/2023  Patient is a difficulty with her feet previously.  Do think that breakdown is likely what is contributing to more of patient's leg pain.  Do not feel that further workup is necessary but new orthotics would be the most beneficial.  Patient's last ones were greater than 64 years old at this time.  Patient is doing a lot more activity as well where she is on her feet.  Patient will be referred to for orthotics that I think will be the most beneficial for her.     Update 03/06/2024 Gina [name] is a 58 y.o. female coming in with complaint of B foot pain. Pain in anterior lower legs that is constant. Had custom orthotics made. Pain decreased in the foot but pain in lower legs did not improve.  Patient feels like she still has some discomfort in the calves noted.  Wanting to know if other adjustments could be beneficial.         Past Medical History:  Diagnosis Date   Acromioclavicular separation, type 1 12/19/2012   Injected in May 21, 2019 repeat injection December 05, 2019 repeat injection given Mar 10, 2021 repeat injection given March 08, 2023     Acute meniscal tear of knee    Allergy    Anxiety    Atypical mole 01/02/2008   Right Mid Paraspinal (slight)   Atypical mole 01/16/2009   Left Mid Paraspinal (moderate)   Atypical mole 10/07/2009   Mid Spinal (moderate to severe) (widershave)   Insomnia    Migraine    PMDD (premenstrual dysphoric disorder)    Past Surgical History:  Procedure Laterality Date   TONSILLECTOMY     Social History   Socioeconomic History   Marital status: Single    Spouse name: Not on file   Number of children: Not on file   Years of  education: Not on file   Highest education level: Not on file  Occupational History   Not on file  Tobacco Use   Smoking status: Never   Smokeless tobacco: Never  Substance and Sexual Activity   Alcohol use: Yes    Alcohol/week: 4.0 standard drinks of alcohol    Types: 2 Cans of beer, 2 Shots of liquor per week   Drug use: No   Sexual activity: Yes    Birth control/protection: None    Comment: with females  Other Topics Concern   Not on file  Social History Narrative   Not on file   Social Drivers of Health   Financial Resource Strain: Not on file  Food Insecurity: Not on file  Transportation Needs: Not on file  Physical Activity: Not on file  Stress: Not on file  Social Connections: Not on file   No Known Allergies Family History  Problem Relation Age of Onset   Hyperlipidemia Father    Hypertension Father    Prostate cancer Father    Prostate cancer Brother    Alcohol abuse Brother    Hypertension Daughter    Breast cancer Maternal Grandmother    Varicose Veins Maternal Grandfather    Hyperlipidemia Paternal Grandfather     Current Outpatient Medications (Endocrine & Metabolic):  norethindrone-ethinyl estradiol (JINTELI) 1-5 MG-MCG TABS tablet, Take by mouth daily.    Current Outpatient Medications (Analgesics):    ibuprofen  (ADVIL ) 600 MG tablet, TAKE 1 TABLET BY MOUTH EVERY 8 HOURS AS NEEDED   Current Outpatient Medications (Other):    Ascorbic Acid (VITAMIN C) 100 MG tablet, Take 100 mg by mouth daily.   Cholecalciferol (VITAMIN D3) 1.25 MG (50000 UT) CAPS, Take by mouth.   clonazePAM (KLONOPIN) 0.5 MG tablet, clonazepam 0.5 mg tablet   gabapentin  (NEURONTIN ) 300 MG capsule, Take 1 capsule by mouth at bedtime   magnesium 30 MG tablet, Take 30 mg by mouth 2 (two) times daily.   Multiple Vitamin (MULTIVITAMIN ADULT PO),    St Johns Wort 150 MG CAPS, Take by mouth.   Testosterone  20 % CREA, Compounded Testosterone  Cream/Gel 2%  Testosterone  Proprionate  2 mg/gram in gel or cream base  use small pea sized amount as directed once a week   Reviewed prior external information including notes and imaging from  primary care provider As well as notes that were available from care everywhere and other healthcare systems.  Past medical history, social, surgical and family history all reviewed in electronic medical record.  No pertanent information unless stated regarding to the chief complaint.   Review of Systems:  No headache, visual changes, nausea, vomiting, diarrhea, constipation, dizziness, abdominal pain, skin rash, fevers, chills, night sweats, weight loss, swollen lymph nodes, body aches, joint swelling, chest pain, shortness of breath, mood changes. POSITIVE muscle aches  Objective  Blood pressure 132/88, pulse 78, height 5\' 6"  (1.676 m).   General: No apparent distress alert and oriented x3 mood and affect normal, dressed appropriately.  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  Breakdown of the transverse arch noted as well.  Patient does have some hallux limitus noted.     Impression and Recommendations:     The above documentation has been reviewed and is accurate and complete Gina [name] M Gina Enrico, DO

## 2024-03-06 NOTE — Patient Instructions (Signed)
 Good to see you Made adjustments No more work related injuries See me in 2 months

## 2024-03-07 ENCOUNTER — Encounter: Payer: Self-pay | Admitting: Family Medicine

## 2024-03-07 NOTE — Assessment & Plan Note (Addendum)
 Discussed with patient at great length.  Made significant adjustments to her orthotics today.  Changing with more of a heel lift to allow patient to be able to roll her foot on a more a regular basis.  Lateral wedges used as well to try to keep patient from supinating the into the fifth phalanx.  Hopeful that this will make significant improvement.  Follow-up again in 2 months

## 2024-04-05 NOTE — Progress Notes (Unsigned)
 [facility] [address] [place] [address] Phone: [phone] Subjective:   IBryan Caprio, am serving as a scribe for Dr. Ronnell Coins.  I'm seeing this patient by the request  of:  Luevenia Saha, MD  CC: Feet and lower leg pain  [id number]  03/06/2024 Discussed with patient at great length. Made significant adjustments to her orthotics today. Changing with more of a heel lift to allow patient to be able to roll her foot on a more a regular basis. Lateral wedges used as well to try to keep patient from supinating the into the fifth phalanx. Hopeful that this will make significant improvement. Follow-up again in 2 months   Update 04/06/2024 Gina [name] is a 58 y.o. female coming in with complaint of B foot pain.  Attempted to make adjustments of the patient's orthotics.  This was 1 month ago.  Patient states pain is about the same as last visit. Nothing is helping.     Past Medical History:  Diagnosis Date   Acromioclavicular separation, type 1 12/19/2012   Injected in May 21, 2019 repeat injection December 05, 2019 repeat injection given Mar 10, 2021 repeat injection given March 08, 2023     Acute meniscal tear of knee    Allergy    Anxiety    Atypical mole 01/02/2008   Right Mid Paraspinal (slight)   Atypical mole 01/16/2009   Left Mid Paraspinal (moderate)   Atypical mole 10/07/2009   Mid Spinal (moderate to severe) (widershave)   Insomnia    Migraine    PMDD (premenstrual dysphoric disorder)    Past Surgical History:  Procedure Laterality Date   TONSILLECTOMY     Social History   Socioeconomic History   Marital status: Single    Spouse name: Not on file   Number of children: Not on file   Years of education: Not on file   Highest education level: Not on file  Occupational History   Not on file  Tobacco Use   Smoking status: Never   Smokeless tobacco: Never  Substance and Sexual Activity   Alcohol use: Yes     Alcohol/week: 4.0 standard drinks of alcohol    Types: 2 Cans of beer, 2 Shots of liquor per week   Drug use: No   Sexual activity: Yes    Birth control/protection: None    Comment: with females  Other Topics Concern   Not on file  Social History Narrative   Not on file   Social Drivers of Health   Financial Resource Strain: Not on file  Food Insecurity: Not on file  Transportation Needs: Not on file  Physical Activity: Not on file  Stress: Not on file  Social Connections: Not on file   No Known Allergies Family History  Problem Relation Age of Onset   Hyperlipidemia Father    Hypertension Father    Prostate cancer Father    Prostate cancer Brother    Alcohol abuse Brother    Hypertension Daughter    Breast cancer Maternal Grandmother    Varicose Veins Maternal Grandfather    Hyperlipidemia Paternal Grandfather     Current Outpatient Medications (Endocrine & Metabolic):    norethindrone-ethinyl estradiol (JINTELI) 1-5 MG-MCG TABS tablet, Take by mouth daily.    Current Outpatient Medications (Analgesics):    ibuprofen  (ADVIL ) 600 MG tablet, TAKE 1 TABLET BY MOUTH EVERY 8 HOURS AS NEEDED   Current Outpatient Medications (Other):    Ascorbic Acid (  VITAMIN C) 100 MG tablet, Take 100 mg by mouth daily.   Cholecalciferol (VITAMIN D3) 1.25 MG (50000 UT) CAPS, Take by mouth.   clonazePAM (KLONOPIN) 0.5 MG tablet, clonazepam 0.5 mg tablet   gabapentin  (NEURONTIN ) 300 MG capsule, Take 1 capsule by mouth at bedtime   magnesium 30 MG tablet, Take 30 mg by mouth 2 (two) times daily.   Multiple Vitamin (MULTIVITAMIN ADULT PO),    St Johns Wort 150 MG CAPS, Take by mouth.   Testosterone  20 % CREA, Compounded Testosterone  Cream/Gel 2%  Testosterone  Proprionate 2 mg/gram in gel or cream base  use small pea sized amount as directed once a week   Reviewed prior external information including notes and imaging from  primary care provider As well as notes that were available from  care everywhere and other healthcare systems.  Past medical history, social, surgical and family history all reviewed in electronic medical record.  No pertanent information unless stated regarding to the chief complaint.   Review of Systems:  No headache, visual changes, nausea, vomiting, diarrhea, constipation, dizziness, abdominal pain, skin rash, fevers, chills, night sweats, weight loss, swollen lymph nodes, body aches, joint swelling, chest pain, shortness of breath, mood changes. POSITIVE muscle aches and cramping even at night  Objective  There were no vitals taken for this visit.   General: No apparent distress alert and oriented x3 mood and affect normal, dressed appropriately.  HEENT: Pupils equal, extraocular movements intact  Respiratory: Patient's speak in full sentences and does not appear short of breath  Cardiovascular: No lower extremity edema, non tender, no erythema  Foot exam deferred mostly.  Nontender on exam while in the shoes.  Neurovascularly intact it appears.    Impression and Recommendations:    The above documentation has been reviewed and is accurate and complete Kincaid Tiger M Leonte Horrigan, DO

## 2024-04-06 ENCOUNTER — Encounter: Payer: Self-pay | Admitting: Family Medicine

## 2024-04-06 ENCOUNTER — Ambulatory Visit: Admitting: Family Medicine

## 2024-04-06 VITALS — BP 128/80 | HR 77 | Ht 66.0 in

## 2024-04-06 DIAGNOSIS — M2550 Pain in unspecified joint: Secondary | ICD-10-CM

## 2024-04-06 DIAGNOSIS — M79671 Pain in right foot: Secondary | ICD-10-CM | POA: Diagnosis not present

## 2024-04-06 DIAGNOSIS — M216X9 Other acquired deformities of unspecified foot: Secondary | ICD-10-CM | POA: Diagnosis not present

## 2024-04-06 DIAGNOSIS — M79672 Pain in left foot: Secondary | ICD-10-CM | POA: Diagnosis not present

## 2024-04-06 LAB — CBC WITH DIFFERENTIAL/PLATELET
Basophils Absolute: 0 10*3/uL (ref 0.0–0.1)
Basophils Relative: 0.4 % (ref 0.0–3.0)
Eosinophils Absolute: 0 10*3/uL (ref 0.0–0.7)
Eosinophils Relative: 0.9 % (ref 0.0–5.0)
HCT: 38.2 % (ref 36.0–46.0)
Hemoglobin: 13.1 g/dL (ref 12.0–15.0)
Lymphocytes Relative: 23.6 % (ref 12.0–46.0)
Lymphs Abs: 1.2 10*3/uL (ref 0.7–4.0)
MCHC: 34.2 g/dL (ref 30.0–36.0)
MCV: 94.7 fl (ref 78.0–100.0)
Monocytes Absolute: 0.5 10*3/uL (ref 0.1–1.0)
Monocytes Relative: 9.5 % (ref 3.0–12.0)
Neutro Abs: 3.3 10*3/uL (ref 1.4–7.7)
Neutrophils Relative %: 65.6 % (ref 43.0–77.0)
Platelets: 337 10*3/uL (ref 150.0–400.0)
RBC: 4.03 Mil/uL (ref 3.87–5.11)
RDW: 12.2 % (ref 11.5–15.5)
WBC: 5 10*3/uL (ref 4.0–10.5)

## 2024-04-06 LAB — IBC PANEL
Iron: 102 ug/dL (ref 42–145)
Saturation Ratios: 29.3 % (ref 20.0–50.0)
TIBC: 348.6 ug/dL (ref 250.0–450.0)
Transferrin: 249 mg/dL (ref 212.0–360.0)

## 2024-04-06 LAB — SEDIMENTATION RATE: Sed Rate: <1 mm/h (ref 0–30)

## 2024-04-06 LAB — COMPREHENSIVE METABOLIC PANEL WITH GFR
ALT: 14 U/L (ref 0–35)
AST: 23 U/L (ref 0–37)
Albumin: 4.3 g/dL (ref 3.5–5.2)
Alkaline Phosphatase: 42 U/L (ref 39–117)
BUN: 10 mg/dL (ref 6–23)
CO2: 28 meq/L (ref 19–32)
Calcium: 9.3 mg/dL (ref 8.4–10.5)
Chloride: 100 meq/L (ref 96–112)
Creatinine, Ser: 0.8 mg/dL (ref 0.40–1.20)
GFR: 82.63 mL/min (ref >60.00)
Glucose, Bld: 97 mg/dL (ref 70–99)
Potassium: 4.5 meq/L (ref 3.5–5.1)
Sodium: 135 meq/L (ref 135–145)
Total Bilirubin: 0.5 mg/dL (ref 0.2–1.2)
Total Protein: 6.6 g/dL (ref 6.0–8.3)

## 2024-04-06 LAB — MAGNESIUM: Magnesium: 2.1 mg/dL (ref 1.5–2.5)

## 2024-04-06 LAB — TESTOSTERONE: Testosterone: 15.85 ng/dL (ref 15.00–40.00)

## 2024-04-06 LAB — URIC ACID: Uric Acid, Serum: 3.1 mg/dL (ref 2.4–7.0)

## 2024-04-06 LAB — VITAMIN D 25 HYDROXY (VIT D DEFICIENCY, FRACTURES): VITD: 44.87 ng/mL (ref 30.00–100.00)

## 2024-04-06 LAB — VITAMIN B12: Vitamin B-12: 556 pg/mL (ref 211–911)

## 2024-04-06 LAB — TSH: TSH: 1.57 u[IU]/mL (ref 0.35–5.50)

## 2024-04-06 LAB — FERRITIN: Ferritin: 33.9 ng/mL (ref 10.0–291.0)

## 2024-04-06 NOTE — Assessment & Plan Note (Signed)
 Attempted to fix the transverse arches.  Continuing to have difficulty.  Do feel laboratory workup is necessary to make sure that there is no deficiencies that could be potentially contributing.  We discussed neurogenic as well as vascular pathologies and possible ABIs may be necessary if this continues.  Patient is on hormone replacement therapy and will check hormone levels to make sure they are not potentially contributing either.  Patient is in agreement with the plan and will make adjustments where necessary.

## 2024-04-06 NOTE — Patient Instructions (Signed)
 Good to see you! Labs today See you again in 7-8 weeks

## 2024-04-09 ENCOUNTER — Ambulatory Visit: Payer: Self-pay | Admitting: Family Medicine

## 2024-04-09 LAB — PTH, INTACT AND CALCIUM
Calcium: 9.5 mg/dL (ref 8.6–10.4)
PTH: 28 pg/mL (ref 16–77)

## 2024-04-12 LAB — ESTROGENS, TOTAL: Estrogen: 123 pg/mL

## 2024-05-23 NOTE — Progress Notes (Unsigned)
 [facility] [address] [place] [address] Phone: [phone] Subjective:   LILLETTE Berwyn Posey, am serving as a scribe for Dr. Arthea Claudene.  I'm seeing this patient by the request  of:  Jodie Lavern CROME, MD  CC: bilateral foot pain back pain   [id number]  04/06/2024 Attempted to fix the transverse arches. Continuing to have difficulty. Do feel laboratory workup is necessary to make sure that there is no deficiencies that could be potentially contributing. We discussed neurogenic as well as vascular pathologies and possible ABIs may be necessary if this continues. Patient is on hormone replacement therapy and will check hormone levels to make sure they are not potentially contributing either. Patient is in agreement with the plan and will make adjustments where necessary.   Update 05/24/2024 Janah Mcculloh is a 58 y.o. female coming in with complaint of B foot pain. Patient states that she is having pain in tib antieor that is constant. Patient is drinking salt water and that is helping somewhat. Feet are doing ok.        Past Medical History:  Diagnosis Date   Acromioclavicular separation, type 1 12/19/2012   Injected in May 21, 2019 repeat injection December 05, 2019 repeat injection given Mar 10, 2021 repeat injection given March 08, 2023     Acute meniscal tear of knee    Allergy    Anxiety    Atypical mole 01/02/2008   Right Mid Paraspinal (slight)   Atypical mole 01/16/2009   Left Mid Paraspinal (moderate)   Atypical mole 10/07/2009   Mid Spinal (moderate to severe) (widershave)   Insomnia    Migraine    PMDD (premenstrual dysphoric disorder)    Past Surgical History:  Procedure Laterality Date   TONSILLECTOMY     Social History   Socioeconomic History   Marital status: Single    Spouse name: Not on file   Number of children: Not on file   Years of education: Not on file   Highest education level: Not on file   Occupational History   Not on file  Tobacco Use   Smoking status: Never   Smokeless tobacco: Never  Substance and Sexual Activity   Alcohol use: Yes    Alcohol/week: 4.0 standard drinks of alcohol    Types: 2 Cans of beer, 2 Shots of liquor per week   Drug use: No   Sexual activity: Yes    Birth control/protection: None    Comment: with females  Other Topics Concern   Not on file  Social History Narrative   Not on file   Social Drivers of Health   Financial Resource Strain: Not on file  Food Insecurity: Not on file  Transportation Needs: Not on file  Physical Activity: Not on file  Stress: Not on file  Social Connections: Not on file   No Known Allergies Family History  Problem Relation Age of Onset   Hyperlipidemia Father    Hypertension Father    Prostate cancer Father    Prostate cancer Brother    Alcohol abuse Brother    Hypertension Daughter    Breast cancer Maternal Grandmother    Varicose Veins Maternal Grandfather    Hyperlipidemia Paternal Grandfather     Current Outpatient Medications (Endocrine & Metabolic):    norethindrone-ethinyl estradiol (JINTELI) 1-5 MG-MCG TABS tablet, Take by mouth daily.    Current Outpatient Medications (Analgesics):    ibuprofen  (ADVIL ) 600 MG tablet, TAKE 1 TABLET BY MOUTH  EVERY 8 HOURS AS NEEDED   Current Outpatient Medications (Other):    Ascorbic Acid (VITAMIN C) 100 MG tablet, Take 100 mg by mouth daily.   Cholecalciferol (VITAMIN D3) 1.25 MG (50000 UT) CAPS, Take by mouth.   clonazePAM (KLONOPIN) 0.5 MG tablet, clonazepam 0.5 mg tablet   gabapentin  (NEURONTIN ) 300 MG capsule, Take 1 capsule by mouth at bedtime   magnesium 30 MG tablet, Take 30 mg by mouth 2 (two) times daily.   Multiple Vitamin (MULTIVITAMIN ADULT PO),    St Johns Wort 150 MG CAPS, Take by mouth.   Testosterone  20 % CREA, Compounded Testosterone  Cream/Gel 2%  Testosterone  Proprionate 2 mg/gram in gel or cream base  use small pea sized amount as  directed once a week   Reviewed prior external information including notes and imaging from  primary care provider As well as notes that were available from care everywhere and other healthcare systems.  Past medical history, social, surgical and family history all reviewed in electronic medical record.  No pertanent information unless stated regarding to the chief complaint.   Review of Systems:  No headache, visual changes, nausea, vomiting, diarrhea, constipation, dizziness, abdominal pain, skin rash, fevers, chills, night sweats, weight loss, swollen lymph nodes, body aches, joint swelling, chest pain, shortness of breath, mood changes. POSITIVE muscle aches  Objective  Blood pressure (!) 132/92, pulse 63, height 5' 6 (1.676 m), weight 134 lb (60.8 kg), SpO2 98%.   General: No apparent distress alert and oriented x3 mood and affect normal, dressed appropriately.  HEENT: Pupils equal, extraocular movements intact  Respiratory: Patient's speak in full sentences and does not appear short of breath  Cardiovascular: No lower extremity edema, non tender, no erythema  Low back movement does have some loss of lordosis noted.  Neck exam does have some tightness noted.  Negative Spurling's today.  5 out of 5 strength of the  Tightness noted of the lateral compartment of the lower extremities.  Also some in the anterior compartment of the leg.  No masses appreciated.  Osteopathic findings  C2 flexed rotated and side bent right C4 flexed rotated and side bent left C6 flexed rotated and side bent left T3 extended rotated and side bent right inhaled third rib T9 extended rotated and side bent left L2 flexed rotated and side bent right Sacrum right on right    Impression and Recommendations:    The above documentation has been reviewed and is accurate and complete Shateka Petrea M Castulo Scarpelli, DO

## 2024-05-24 ENCOUNTER — Ambulatory Visit: Admitting: Family Medicine

## 2024-05-24 ENCOUNTER — Encounter: Payer: Self-pay | Admitting: Family Medicine

## 2024-05-24 ENCOUNTER — Other Ambulatory Visit: Payer: Self-pay

## 2024-05-24 VITALS — BP 132/92 | HR 63 | Ht 66.0 in | Wt 134.0 lb

## 2024-05-24 DIAGNOSIS — M79671 Pain in right foot: Secondary | ICD-10-CM | POA: Diagnosis not present

## 2024-05-24 DIAGNOSIS — M9902 Segmental and somatic dysfunction of thoracic region: Secondary | ICD-10-CM

## 2024-05-24 DIAGNOSIS — M79A22 Nontraumatic compartment syndrome of left lower extremity: Secondary | ICD-10-CM

## 2024-05-24 DIAGNOSIS — M9908 Segmental and somatic dysfunction of rib cage: Secondary | ICD-10-CM

## 2024-05-24 DIAGNOSIS — Z8669 Personal history of other diseases of the nervous system and sense organs: Secondary | ICD-10-CM | POA: Diagnosis not present

## 2024-05-24 DIAGNOSIS — M79A21 Nontraumatic compartment syndrome of right lower extremity: Secondary | ICD-10-CM | POA: Diagnosis not present

## 2024-05-24 DIAGNOSIS — G5702 Lesion of sciatic nerve, left lower limb: Secondary | ICD-10-CM

## 2024-05-24 DIAGNOSIS — M9903 Segmental and somatic dysfunction of lumbar region: Secondary | ICD-10-CM

## 2024-05-24 DIAGNOSIS — M9904 Segmental and somatic dysfunction of sacral region: Secondary | ICD-10-CM

## 2024-05-24 DIAGNOSIS — M79672 Pain in left foot: Secondary | ICD-10-CM

## 2024-05-24 DIAGNOSIS — M79662 Pain in left lower leg: Secondary | ICD-10-CM

## 2024-05-24 DIAGNOSIS — M79661 Pain in right lower leg: Secondary | ICD-10-CM

## 2024-05-24 DIAGNOSIS — M9901 Segmental and somatic dysfunction of cervical region: Secondary | ICD-10-CM

## 2024-05-24 NOTE — Patient Instructions (Addendum)
 PT piriformis, exertional compartment syndrome with Campell DHEA 50mg  daily for 4 weeks See me in 2 months

## 2024-05-24 NOTE — Assessment & Plan Note (Signed)
 Responded very well to osteopathic nebulizing.  Discussed icing regimen and home exercises, discussed with patient posture and ergonomics.  Responds well to osteopathic manipulation.  Follow-up again in 6 weeks

## 2024-05-24 NOTE — Assessment & Plan Note (Signed)
 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  Has done multiple different treatment options including new shoes, orthotics, patient is doing more sodium in her electrolytes.  Patient is still having some difficulty.  Will refer to physical therapy to see if such things as dry needling, Graston tool could be beneficial.  Worsening pain I think compartment testing may be necessary

## 2024-05-30 ENCOUNTER — Other Ambulatory Visit: Payer: Self-pay | Admitting: Family Medicine

## 2024-06-07 ENCOUNTER — Encounter: Payer: Self-pay | Admitting: Physical Therapy

## 2024-06-07 ENCOUNTER — Ambulatory Visit: Admitting: Physical Therapy

## 2024-06-07 ENCOUNTER — Other Ambulatory Visit: Payer: Self-pay

## 2024-06-07 DIAGNOSIS — M79662 Pain in left lower leg: Secondary | ICD-10-CM | POA: Diagnosis not present

## 2024-06-07 DIAGNOSIS — M6281 Muscle weakness (generalized): Secondary | ICD-10-CM

## 2024-06-07 DIAGNOSIS — M25551 Pain in right hip: Secondary | ICD-10-CM | POA: Diagnosis not present

## 2024-06-07 DIAGNOSIS — M79661 Pain in right lower leg: Secondary | ICD-10-CM | POA: Diagnosis not present

## 2024-06-07 NOTE — Therapy (Signed)
 OUTPATIENT PHYSICAL THERAPY EVALUATION   Patient Name: Gina [name] MRN: [id number] DOB:01-Oct-1966, 58 y.o., female Today's Date: 06/07/2024   END OF SESSION:  PT End of Session - 06/07/24 0808     Visit Number 1    Date for PT Re-Evaluation 08/02/24    Authorization Type BCBS    PT Start Time 0800    PT Stop Time 0845    PT Time Calculation (min) 45 min    Activity Tolerance Patient tolerated treatment well    Behavior During Therapy [facility] for tasks assessed/performed          Past Medical History:  Diagnosis Date   Acromioclavicular separation, type 1 12/19/2012   Injected in May 21, 2019 repeat injection December 05, 2019 repeat injection given Mar 10, 2021 repeat injection given March 08, 2023     Acute meniscal tear of knee    Allergy    Anxiety    Atypical mole 01/02/2008   Right Mid Paraspinal (slight)   Atypical mole 01/16/2009   Left Mid Paraspinal (moderate)   Atypical mole 10/07/2009   Mid Spinal (moderate to severe) terril)   Insomnia    Migraine    PMDD (premenstrual dysphoric disorder)    Past Surgical History:  Procedure Laterality Date   TONSILLECTOMY     Patient Active Problem List   Diagnosis Date Noted   Bilateral calf pain 05/24/2024   Loss of transverse plantar arch 12/16/2023   History of migraine headaches 11/16/2023   White coat syndrome without diagnosis of hypertension 11/16/2023   Secondary insomnia 11/16/2023   History of anxiety 11/16/2023   Hormone replacement therapy (HRT) 11/16/2023   Menopausal syndrome 11/23/2022   Iron deficiency anemia 01/09/2018    PCP: Jodie Lavern CROME, MD  REFERRING PROVIDER: Claudene Arthea HERO, DO  REFERRING DIAG: Piriformis syndrome, left; Exertional compartment syndrome of lower extremity, bilateral  THERAPY DIAG:  Pain in left lower leg  Pain in right lower leg  Pain in right hip  Rationale for Evaluation and Treatment: Rehabilitation  ONSET DATE: Chronic   SUBJECTIVE:   SUBJECTIVE STATEMENT: Patient reports bilateral lower leg pain and plantar foot pain for more than 6 months. She has used orthotics that have helped some but haven't alleviated all the pain. Pain in lower legs is constant. She also reports right hip tightness that feels like knotted and doesn't have as much flexibility. The hip tightness has been on and off for years. The hip tightness and lower leg pain is exacerbated with walking, so she doesn't walk as much anymore. Sometimes lifting heavy things from a squat is painful in the hip. She also reports she doesn't like to sleep on the right side because it uncomfortable. She does report some tingling in her feet with fatigue.  PERTINENT HISTORY: See PMH above  PAIN:  Are you having pain? Yes:  NPRS scale: 7/10 Pain location: Bilateral lower legs, plantar foot Pain description: Tightness, radiating Aggravating factors: Constant, walking Relieving factors: Using a roller, orthotics  NPRS scale: 3/10 Pain location: Right hip, posterior Pain description: Tightness Aggravating factors: Walking Relieving factors: Stretching, rolling  PRECAUTIONS: None  RED FLAGS: None   WEIGHT BEARING RESTRICTIONS: No  FALLS:  Has patient fallen in last 6 months? No  OCCUPATION: Requires walking and being on feet  PLOF: Independent  PATIENT GOALS: Pain relief to improve walking   OBJECTIVE:  Note: Objective measures were completed at Evaluation unless otherwise noted. PATIENT SURVEYS:  PSFS: 3.33 Long walks / hikes:  4 Sleeping through the night: 3 Cycling: 3  COGNITION: Overall cognitive status: Within functional limits for tasks assessed     SENSATION: WFL  MUSCLE LENGTH: Slight hamstring and piriformis restriction on right  POSTURE:   Grossly WFL  PALPATION: Tender to palpation right gluteal and piriformis region, bilateral anterior tib  LOWER EXTREMITY ROM:  Active ROM Right eval Left eval  Hip flexion    Hip extension     Hip abduction    Hip adduction    Hip internal rotation    Hip external rotation    Knee flexion    Knee extension    Ankle dorsiflexion 10 10  Ankle plantarflexion 60 70  Ankle inversion 50 50  Ankle eversion 15 15   (Blank rows = not tested)  Hip PROM grossly WFL  LOWER EXTREMITY MMT:  MMT Right eval Left eval  Hip flexion    Hip extension 4 4  Hip abduction 4- 4  Hip adduction    Hip internal rotation    Hip external rotation    Knee flexion    Knee extension    Ankle dorsiflexion 5 5  Ankle plantarflexion 5 5  Ankle inversion 5 5  Ankle eversion 5 5   (Blank rows = not tested)  FUNCTIONAL TESTS:  Squat: decreased depth, hip dominant technique, right hip tightness  GAIT: Assistive device utilized: None Level of assistance: Complete Independence Comments: Grossly WFL                                                                                                                               TREATMENT [facility] Adult PT Treatment:                                                DATE: 06/07/2024 Supine active hamstring stretch with ankle DF Side clamshell with blue Standing at wall toe raises  PATIENT EDUCATION:  Education details: Exam findings, POC, HEP Person educated: Patient Education method: Explanation, Demonstration, Tactile cues, Verbal cues, and Handouts Education comprehension: verbalized understanding, returned demonstration, verbal cues required, tactile cues required, and needs further education  HOME EXERCISE PROGRAM: Access Code: [email or web address]    ASSESSMENT: CLINICAL IMPRESSION: Patient is a 58 y.o. female who was seen today for physical therapy evaluation and treatment for chronic anterior lower leg pain and right hip tightness. She does report tenderness of bilateral anterior tib and right glute and piriformis region. She has some muscular tightness of the right hip as well as strength deficit. She demonstrates good ankle motion and strength.  Performed TPDN for the right glute and piriformis region as well as bilateral anterior tib with twitch response.    OBJECTIVE IMPAIRMENTS: decreased activity tolerance, decreased strength, impaired flexibility, and pain.   ACTIVITY LIMITATIONS: squatting, sleeping, and locomotion level  PARTICIPATION LIMITATIONS:  community activity  PERSONAL FACTORS: Fitness, Past/current experiences, and Time since onset of injury/illness/exacerbation are also affecting patient's functional outcome.   REHAB POTENTIAL: Good  CLINICAL DECISION MAKING: Stable/uncomplicated  EVALUATION COMPLEXITY: Low   GOALS: Goals reviewed with patient? Yes  SHORT TERM GOALS: Target date: 07/05/2024  Patient will be I with initial HEP in order to progress with therapy. Baseline: HEP provided at eval Goal status: INITIAL  2.  Patient will report bilateral lower leg pain </= 4/10 in order to reduce functional limitations and improve walking Baseline: 7/10 Goal status: INITIAL  LONG TERM GOALS: Target date: 08/02/2024  Patient will be I with final HEP to maintain progress from PT. Baseline: HEP provided at eval Goal status: INITIAL  2.  Patient will report PSFS >/= 7 in order to indicate improvement in their functional ability. Baseline: 3.33 Goal status: INITIAL  3.  Patient will demonstrate right glute strength >/= 4/5 MMT in order to improve hip control and reduce tightness Baseline: 4-/5 MMT Goal status: INITIAL  4.  Patient will report bilateral lower leg pain </= 2/10 and right hip tightness </= 1/10 in order to reduce functional limitations Baseline: see pain assessment above Goal status: INITIAL   PLAN: PT FREQUENCY: 1x/week  PT DURATION: 8 weeks  PLANNED INTERVENTIONS: 97164- PT Re-evaluation, 97750- Physical Performance Testing, 97110-Therapeutic exercises, 97530- Therapeutic activity, 97112- Neuromuscular re-education, 97535- Self Care, 02859- Manual therapy, 20560 (1-2 muscles), 20561 (3+  muscles)- Dry Needling, Patient/Family education, Balance training, Taping, Joint mobilization, Joint manipulation, Spinal manipulation, Spinal mobilization, Cryotherapy, and Moist heat  PLAN FOR NEXT SESSION: Review HEP and progress PRN, manual/TPDN for right hip and bilateral lower legs, continue with ankle and hip strengthening to improve activity tolerance   Elaine Daring, PT, DPT, LAT, ATC 06/07/24  3:58 PM Phone: [phone] Fax: [phone]

## 2024-06-07 NOTE — Patient Instructions (Signed)
 Access Code: [email or web address] URL: [email or web address] Date: 06/07/2024 Prepared by: Elaine Daring  Exercises - Supine Hamstring Stretch  - 1 x daily - 2 sets - 10 reps - 5 seconds hold - Clam with Resistance  - 1 x daily - 3 sets - 10 reps - Toe Raise With Back Against Wall  - 1 x daily - 3 sets - 10 reps - 5 seconds hold

## 2024-06-19 ENCOUNTER — Encounter: Payer: Self-pay | Admitting: Physical Therapy

## 2024-06-19 ENCOUNTER — Other Ambulatory Visit: Payer: Self-pay

## 2024-06-19 ENCOUNTER — Ambulatory Visit: Admitting: Physical Therapy

## 2024-06-19 DIAGNOSIS — M25551 Pain in right hip: Secondary | ICD-10-CM

## 2024-06-19 DIAGNOSIS — M79661 Pain in right lower leg: Secondary | ICD-10-CM

## 2024-06-19 DIAGNOSIS — M6281 Muscle weakness (generalized): Secondary | ICD-10-CM

## 2024-06-19 DIAGNOSIS — M79662 Pain in left lower leg: Secondary | ICD-10-CM

## 2024-06-19 NOTE — Patient Instructions (Signed)
 Access Code: [email or web address] URL: [email or web address] Date: 06/19/2024 Prepared by: Elaine Daring  Exercises - Supine Piriformis Stretch with Foot on Ground  - 1 x daily - 3 sets - 10 reps - Supine Hamstring Stretch  - 1 x daily - 2 sets - 10 reps - 5 seconds hold - Clam with Resistance  - 1 x daily - 3 sets - 10 reps - Toe Raise With Back Against Wall  - 1 x daily - 3 sets - 10 reps - 5 seconds hold - Seated Ankle Eversion with Resistance  - 1 x daily - 3 sets - 10 reps

## 2024-06-19 NOTE — Therapy (Signed)
 OUTPATIENT PHYSICAL THERAPY TREATMENT   Patient Name: Gina [name] MRN: [id number] DOB:07/30/1966, 58 y.o., female Today's Date: 06/20/2024   END OF SESSION:  PT End of Session - 06/19/24 1526     Visit Number 2    Number of Visits 9    Date for PT Re-Evaluation 08/02/24    Authorization Type BCBS    PT Start Time 1520    PT Stop Time 1603    PT Time Calculation (min) 43 min    Activity Tolerance Patient tolerated treatment well    Behavior During Therapy [facility] for tasks assessed/performed           Past Medical History:  Diagnosis Date   Acromioclavicular separation, type 1 12/19/2012   Injected in May 21, 2019 repeat injection December 05, 2019 repeat injection given Mar 10, 2021 repeat injection given March 08, 2023     Acute meniscal tear of knee    Allergy    Anxiety    Atypical mole 01/02/2008   Right Mid Paraspinal (slight)   Atypical mole 01/16/2009   Left Mid Paraspinal (moderate)   Atypical mole 10/07/2009   Mid Spinal (moderate to severe) terril)   Insomnia    Migraine    PMDD (premenstrual dysphoric disorder)    Past Surgical History:  Procedure Laterality Date   TONSILLECTOMY     Patient Active Problem List   Diagnosis Date Noted   Bilateral calf pain 05/24/2024   Loss of transverse plantar arch 12/16/2023   History of migraine headaches 11/16/2023   White coat syndrome without diagnosis of hypertension 11/16/2023   Secondary insomnia 11/16/2023   History of anxiety 11/16/2023   Hormone replacement therapy (HRT) 11/16/2023   Menopausal syndrome 11/23/2022   Iron deficiency anemia 01/09/2018    PCP: Jodie Lavern CROME, MD  REFERRING PROVIDER: Claudene Arthea HERO, DO  REFERRING DIAG: Piriformis syndrome, left; Exertional compartment syndrome of lower extremity, bilateral  THERAPY DIAG:  Pain in left lower leg  Pain in right lower leg  Pain in right hip  Muscle weakness (generalized)  Rationale for Evaluation and Treatment:  Rehabilitation  ONSET DATE: Chronic   SUBJECTIVE:  SUBJECTIVE STATEMENT: Patient reports feels like both bones on lower legs are sore. She reports right hip is feel a little better is noticing the lower aspect of the hip is seizing up more. States the lower legs have still been hurting so she has not been doing the exercises.   Eval: Patient reports bilateral lower leg pain and plantar foot pain for more than 6 months. She has used orthotics that have helped some but haven't alleviated all the pain. Pain in lower legs is constant. She also reports right hip tightness that feels like knotted and doesn't have as much flexibility. The hip tightness has been on and off for years. The hip tightness and lower leg pain is exacerbated with walking, so she doesn't walk as much anymore. Sometimes lifting heavy things from a squat is painful in the hip. She also reports she doesn't like to sleep on the right side because it uncomfortable. She does report some tingling in her feet with fatigue.  PERTINENT HISTORY: See PMH above  PAIN:  Are you having pain? Yes:  NPRS scale: 6-7/10 Pain location: Bilateral lower legs, plantar foot Pain description: Tightness, radiating Aggravating factors: Constant, walking Relieving factors: Using a roller, orthotics  NPRS scale: 5/10 Pain location: Right hip, posterior Pain description: Tightness Aggravating factors: Walking Relieving factors: Stretching, rolling  PRECAUTIONS: None  PATIENT GOALS: Pain relief to improve walking   OBJECTIVE:  Note: Objective measures were completed at Evaluation unless otherwise noted. PATIENT SURVEYS:  PSFS: 3.33 Long walks / hikes: 4 Sleeping through the night: 3 Cycling: 3  MUSCLE LENGTH: Slight hamstring and piriformis restriction on right  POSTURE:   Grossly WFL  PALPATION: Tender to palpation right gluteal and piriformis region, bilateral anterior tib  LOWER EXTREMITY ROM:  Active ROM Right eval  Left eval  Hip flexion    Hip extension    Hip abduction    Hip adduction    Hip internal rotation    Hip external rotation    Knee flexion    Knee extension    Ankle dorsiflexion 10 10  Ankle plantarflexion 60 70  Ankle inversion 50 50  Ankle eversion 15 15   (Blank rows = not tested)  Hip PROM grossly WFL  LOWER EXTREMITY MMT:  MMT Right eval Left eval  Hip flexion    Hip extension 4 4  Hip abduction 4- 4  Hip adduction    Hip internal rotation    Hip external rotation    Knee flexion    Knee extension    Ankle dorsiflexion 5 5  Ankle plantarflexion 5 5  Ankle inversion 5 5  Ankle eversion 5 5   (Blank rows = not tested)  FUNCTIONAL TESTS:  Squat: decreased depth, hip dominant technique, right hip tightness  GAIT: Assistive device utilized: None Level of assistance: Complete Independence Comments: Grossly WFL                                                                                                                               TREATMENT [facility] Adult PT Treatment:                                                DATE: 06/19/2024 STM/TPR right gluteal region and bilateral peroneal region Supine piriformis stretch 3 x 20 sec Supine active hamstring stretch with ankle DF 10 x 5 sec Side clamshell with blue 3 x 10 Seated ankle eversion with green 3 x 15 each  Trigger Point Dry Needling  Subsequent Treatment: Instructions provided previously at initial dry needling treatment.  Instructions reviewed, if requested by the patient, prior to subsequent dry needling treatment.   Patient Verbal Consent Given: No Education Handout Provided: No patient declined Muscles Treated: Right glute/piriformis region, bilateral peroneals Electrical Stimulation Performed: No Treatment Response/Outcome: Twitch response   PATIENT EDUCATION:  Education details: HEP, TPDN Person educated: Patient Education method: Explanation, Demonstration, Tactile cues, Verbal cues, and  Handouts Education comprehension: verbalized understanding, returned demonstration, verbal cues required, tactile cues required, and needs further education  HOME EXERCISE PROGRAM: Access Code: [email or web address]    ASSESSMENT: CLINICAL IMPRESSION: Patient tolerated therapy well with no adverse effects. Continued  with TPDN this visit for right gluteal region and bilateral peroneals with good therapeutic benefit. Therapy continued to focus primarily on improving flexibility of the right hip and progressing strengthening for the hip and bilateral peroneals this visit. She did report feeling looser and less pain following therapy. Updated her HEP to progress stretching and strengthening exercises for home. Patient would benefit from continued skilled PT to progress mobility and strength in order to reduce pain and maximize functional ability.   Eval: Patient is a 58 y.o. female who was seen today for physical therapy evaluation and treatment for chronic anterior lower leg pain and right hip tightness. She does report tenderness of bilateral anterior tib and right glute and piriformis region. She has some muscular tightness of the right hip as well as strength deficit. She demonstrates good ankle motion and strength. Performed TPDN for the right glute and piriformis region as well as bilateral anterior tib with twitch response.    OBJECTIVE IMPAIRMENTS: decreased activity tolerance, decreased strength, impaired flexibility, and pain.   ACTIVITY LIMITATIONS: squatting, sleeping, and locomotion level  PARTICIPATION LIMITATIONS: community activity  PERSONAL FACTORS: Fitness, Past/current experiences, and Time since onset of injury/illness/exacerbation are also affecting patient's functional outcome.    GOALS: Goals reviewed with patient? Yes  SHORT TERM GOALS: Target date: 07/05/2024  Patient will be I with initial HEP in order to progress with therapy. Baseline: HEP provided at eval Goal status:  INITIAL  2.  Patient will report bilateral lower leg pain </= 4/10 in order to reduce functional limitations and improve walking Baseline: 7/10 Goal status: INITIAL  LONG TERM GOALS: Target date: 08/02/2024  Patient will be I with final HEP to maintain progress from PT. Baseline: HEP provided at eval Goal status: INITIAL  2.  Patient will report PSFS >/= 7 in order to indicate improvement in their functional ability. Baseline: 3.33 Goal status: INITIAL  3.  Patient will demonstrate right glute strength >/= 4/5 MMT in order to improve hip control and reduce tightness Baseline: 4-/5 MMT Goal status: INITIAL  4.  Patient will report bilateral lower leg pain </= 2/10 and right hip tightness </= 1/10 in order to reduce functional limitations Baseline: see pain assessment above Goal status: INITIAL   PLAN: PT FREQUENCY: 1x/week  PT DURATION: 8 weeks  PLANNED INTERVENTIONS: 97164- PT Re-evaluation, 97750- Physical Performance Testing, 97110-Therapeutic exercises, 97530- Therapeutic activity, 97112- Neuromuscular re-education, 97535- Self Care, 02859- Manual therapy, 20560 (1-2 muscles), 20561 (3+ muscles)- Dry Needling, Patient/Family education, Balance training, Taping, Joint mobilization, Joint manipulation, Spinal manipulation, Spinal mobilization, Cryotherapy, and Moist heat  PLAN FOR NEXT SESSION: Review HEP and progress PRN, manual/TPDN for right hip and bilateral lower legs, continue with ankle and hip strengthening to improve activity tolerance   Elaine Daring, PT, DPT, LAT, ATC 06/20/24  8:00 AM Phone: [phone] Fax: [phone]

## 2024-06-26 ENCOUNTER — Other Ambulatory Visit: Payer: Self-pay

## 2024-06-26 ENCOUNTER — Ambulatory Visit: Admitting: Physical Therapy

## 2024-06-26 ENCOUNTER — Encounter: Payer: Self-pay | Admitting: Physical Therapy

## 2024-06-26 DIAGNOSIS — M6281 Muscle weakness (generalized): Secondary | ICD-10-CM | POA: Diagnosis not present

## 2024-06-26 DIAGNOSIS — M79661 Pain in right lower leg: Secondary | ICD-10-CM | POA: Diagnosis not present

## 2024-06-26 DIAGNOSIS — M79662 Pain in left lower leg: Secondary | ICD-10-CM | POA: Diagnosis not present

## 2024-06-26 DIAGNOSIS — M25551 Pain in right hip: Secondary | ICD-10-CM | POA: Diagnosis not present

## 2024-06-26 NOTE — Therapy (Signed)
 OUTPATIENT PHYSICAL THERAPY TREATMENT   Patient Name: Gina [name] MRN: [id number] DOB:1966-08-01, 58 y.o., female Today's Date: 06/26/2024   END OF SESSION:  PT End of Session - 06/26/24 1628     Visit Number 3    Number of Visits 9    Date for PT Re-Evaluation 08/02/24    Authorization Type BCBS    PT Start Time 1602    PT Stop Time 1645    PT Time Calculation (min) 43 min    Activity Tolerance Patient tolerated treatment well    Behavior During Therapy [facility] for tasks assessed/performed            Past Medical History:  Diagnosis Date   Acromioclavicular separation, type 1 12/19/2012   Injected in May 21, 2019 repeat injection December 05, 2019 repeat injection given Mar 10, 2021 repeat injection given March 08, 2023     Acute meniscal tear of knee    Allergy    Anxiety    Atypical mole 01/02/2008   Right Mid Paraspinal (slight)   Atypical mole 01/16/2009   Left Mid Paraspinal (moderate)   Atypical mole 10/07/2009   Mid Spinal (moderate to severe) terril)   Insomnia    Migraine    PMDD (premenstrual dysphoric disorder)    Past Surgical History:  Procedure Laterality Date   TONSILLECTOMY     Patient Active Problem List   Diagnosis Date Noted   Bilateral calf pain 05/24/2024   Loss of transverse plantar arch 12/16/2023   History of migraine headaches 11/16/2023   White coat syndrome without diagnosis of hypertension 11/16/2023   Secondary insomnia 11/16/2023   History of anxiety 11/16/2023   Hormone replacement therapy (HRT) 11/16/2023   Menopausal syndrome 11/23/2022   Iron deficiency anemia 01/09/2018    PCP: Jodie Lavern CROME, MD  REFERRING PROVIDER: Claudene Arthea HERO, DO  REFERRING DIAG: Piriformis syndrome, left; Exertional compartment syndrome of lower extremity, bilateral  THERAPY DIAG:  Pain in left lower leg  Pain in right lower leg  Pain in right hip  Muscle weakness (generalized)  Rationale for Evaluation and Treatment:  Rehabilitation  ONSET DATE: Chronic   SUBJECTIVE:  SUBJECTIVE STATEMENT: Patient reports theright hip is still tight and tender, and the lower legs are feeling better but is still having some tightness in the back and lower areas of the lower legs. She does report improvement in ability to sleep through the night regarding the right hip.  Eval: Patient reports bilateral lower leg pain and plantar foot pain for more than 6 months. She has used orthotics that have helped some but haven't alleviated all the pain. Pain in lower legs is constant. She also reports right hip tightness that feels like knotted and doesn't have as much flexibility. The hip tightness has been on and off for years. The hip tightness and lower leg pain is exacerbated with walking, so she doesn't walk as much anymore. Sometimes lifting heavy things from a squat is painful in the hip. She also reports she doesn't like to sleep on the right side because it uncomfortable. She does report some tingling in her feet with fatigue.  PERTINENT HISTORY: See PMH above  PAIN:  Are you having pain? Yes:  NPRS scale: 6-7/10 Pain location: Bilateral lower legs, plantar foot Pain description: Tightness, radiating Aggravating factors: Constant, walking Relieving factors: Using a roller, orthotics  NPRS scale: 5/10 Pain location: Right hip, posterior Pain description: Tightness Aggravating factors: Walking Relieving factors: Stretching, rolling  PRECAUTIONS: None  PATIENT GOALS: Pain relief to improve walking   OBJECTIVE:  Note: Objective measures were completed at Evaluation unless otherwise noted. PATIENT SURVEYS:  PSFS: 3.33 Long walks / hikes: 4 Sleeping through the night: 3 Cycling: 3  MUSCLE LENGTH: Slight hamstring and piriformis restriction on right  POSTURE:   Grossly WFL  PALPATION: Tender to palpation right gluteal and piriformis region, bilateral anterior tib  LOWER EXTREMITY ROM:  Active ROM  Right eval Left eval  Hip flexion    Hip extension    Hip abduction    Hip adduction    Hip internal rotation    Hip external rotation    Knee flexion    Knee extension    Ankle dorsiflexion 10 10  Ankle plantarflexion 60 70  Ankle inversion 50 50  Ankle eversion 15 15   (Blank rows = not tested)  Hip PROM grossly WFL  LOWER EXTREMITY MMT:  MMT Right eval Left eval  Hip flexion    Hip extension 4 4  Hip abduction 4- 4  Hip adduction    Hip internal rotation    Hip external rotation    Knee flexion    Knee extension    Ankle dorsiflexion 5 5  Ankle plantarflexion 5 5  Ankle inversion 5 5  Ankle eversion 5 5   (Blank rows = not tested)  FUNCTIONAL TESTS:  Squat: decreased depth, hip dominant technique, right hip tightness  GAIT: Assistive device utilized: None Level of assistance: Complete Independence Comments: Grossly WFL                                                                                                                               TREATMENT [facility] Adult PT Treatment:                                                DATE: 06/19/2024 STM/TPR right gluteal region and bilateral soleus region Supine piriformis stretch 3 x 20 sec Slant board calf stretch 3 x 20 sec Seated heel raise with 50# 2 x 10 each Figure-4 bridge 2 x 10 each Supine active hamstring stretch with ankle DF 10 x 5 sec Standing heel raises on edge of box 2 x 10  Trigger Point Dry Needling  Subsequent Treatment: Instructions provided previously at initial dry needling treatment.  Instructions reviewed, if requested by the patient, prior to subsequent dry needling treatment.   Patient Verbal Consent Given: No Education Handout Provided: No patient declined Muscles Treated: Right glute/piriformis region, bilateral soleus Electrical Stimulation Performed: No Treatment Response/Outcome: Twitch response   PATIENT EDUCATION:  Education details: HEP update, TPDN Person educated:  Patient Education method: Explanation, Demonstration, Tactile cues, Verbal cues Education comprehension: verbalized understanding, returned demonstration, verbal cues required, tactile cues required, and needs further education  HOME EXERCISE PROGRAM: Access Code:  [email or web address]    ASSESSMENT: CLINICAL IMPRESSION: Patient tolerated therapy well with no adverse effects. Continued with TPDN this visit for right gluteal region and bilateral soleus with good therapeutic benefit. Therapy continued focus on stretching and strengthening for the lower legs and hips with good tolerance. Focused more on calf stretching and strengthening for the lower legs this visit and updated HEP to progress calf strengthening for home. She reports overall improvement in her symptoms this visit and seems to be making good progress with therapy. Patient would benefit from continued skilled PT to progress mobility and strength in order to reduce pain and maximize functional ability.   Eval: Patient is a 58 y.o. female who was seen today for physical therapy evaluation and treatment for chronic anterior lower leg pain and right hip tightness. She does report tenderness of bilateral anterior tib and right glute and piriformis region. She has some muscular tightness of the right hip as well as strength deficit. She demonstrates good ankle motion and strength. Performed TPDN for the right glute and piriformis region as well as bilateral anterior tib with twitch response.    OBJECTIVE IMPAIRMENTS: decreased activity tolerance, decreased strength, impaired flexibility, and pain.   ACTIVITY LIMITATIONS: squatting, sleeping, and locomotion level  PARTICIPATION LIMITATIONS: community activity  PERSONAL FACTORS: Fitness, Past/current experiences, and Time since onset of injury/illness/exacerbation are also affecting patient's functional outcome.    GOALS: Goals reviewed with patient? Yes  SHORT TERM GOALS: Target date:  07/05/2024  Patient will be I with initial HEP in order to progress with therapy. Baseline: HEP provided at eval Goal status: INITIAL  2.  Patient will report bilateral lower leg pain </= 4/10 in order to reduce functional limitations and improve walking Baseline: 7/10 Goal status: INITIAL  LONG TERM GOALS: Target date: 08/02/2024  Patient will be I with final HEP to maintain progress from PT. Baseline: HEP provided at eval Goal status: INITIAL  2.  Patient will report PSFS >/= 7 in order to indicate improvement in their functional ability. Baseline: 3.33 Goal status: INITIAL  3.  Patient will demonstrate right glute strength >/= 4/5 MMT in order to improve hip control and reduce tightness Baseline: 4-/5 MMT Goal status: INITIAL  4.  Patient will report bilateral lower leg pain </= 2/10 and right hip tightness </= 1/10 in order to reduce functional limitations Baseline: see pain assessment above Goal status: INITIAL   PLAN: PT FREQUENCY: 1x/week  PT DURATION: 8 weeks  PLANNED INTERVENTIONS: 97164- PT Re-evaluation, 97750- Physical Performance Testing, 97110-Therapeutic exercises, 97530- Therapeutic activity, 97112- Neuromuscular re-education, 97535- Self Care, 02859- Manual therapy, 20560 (1-2 muscles), 20561 (3+ muscles)- Dry Needling, Patient/Family education, Balance training, Taping, Joint mobilization, Joint manipulation, Spinal manipulation, Spinal mobilization, Cryotherapy, and Moist heat  PLAN FOR NEXT SESSION: Review HEP and progress PRN, manual/TPDN for right hip and bilateral lower legs, continue with ankle and hip strengthening to improve activity tolerance   Elaine Daring, PT, DPT, LAT, ATC 06/26/24  4:46 PM Phone: [phone] Fax: [phone]

## 2024-06-26 NOTE — Patient Instructions (Signed)
 Access Code: [email or web address] URL: [email or web address] Date: 06/26/2024 Prepared by: Elaine Daring  Exercises - Supine Piriformis Stretch with Foot on Ground  - 1 x daily - 3 sets - 30 hold - Supine Hamstring Stretch  - 1 x daily - 2 sets - 10 reps - 5 seconds hold - Clam with Resistance  - 1 x daily - 3 sets - 10 reps - Toe Raise With Back Against Wall  - 1 x daily - 3 sets - 10 reps - 5 seconds hold - Seated Ankle Eversion with Resistance  - 1 x daily - 3 sets - 10 reps - Standing Heel Raise  - 1 x daily - 3 sets - 10 reps

## 2024-06-28 ENCOUNTER — Telehealth: Payer: Self-pay | Admitting: Family Medicine

## 2024-06-28 NOTE — Telephone Encounter (Unsigned)
 Copied from CRM [id number]. Topic: Clinical - Medication Refill >> Jun 28, 2024 12:29 PM [place] J wrote: Medication: clonazePAM (KLONOPIN) 1 MG  Has the patient contacted their pharmacy? No (Agent: If no, request that the patient contact the pharmacy for the refill. If patient does not wish to contact the pharmacy document the reason why and proceed with request.) (Agent: If yes, when and what did the pharmacy advise?)  This is the patient's preferred pharmacy:  [facility] [address] [place] - [address] [place] [address] Phone: [phone] Fax: [phone]  Is this the correct pharmacy for this prescription? Yes If no, delete pharmacy and type the correct one.   Has the prescription been filled recently? No  Is the patient out of the medication? No  Has the patient been seen for an appointment in the last year OR does the patient have an upcoming appointment? Yes  Can we respond through MyChart? Yes  Agent: Please be advised that Rx refills may take up to 3 business days. We ask that you follow-up with your pharmacy.

## 2024-06-28 NOTE — Telephone Encounter (Signed)
 Gina [name] E to [facility] Nurse Triage Pool - Primary Care (Selected Message) CJ    06/28/24 12:32 PM Instead of .5 MG tablets, the patient is requesting 1 MG tablets in a quantity of 45 that she can cut in half. This will allow her to purchase her medication at a cheaper price.  Gina [name] E CJ   06/28/24 12:31 PM Unsigned Note Copied from CRM #[id number]. Topic: Clinical - Medication Refill >> Jun 28, 2024 12:29 PM [name] J wrote: Medication: clonazePAM (KLONOPIN) 1 MG

## 2024-06-30 ENCOUNTER — Other Ambulatory Visit: Payer: Self-pay | Admitting: Family Medicine

## 2024-07-02 ENCOUNTER — Encounter: Payer: Self-pay | Admitting: Physical Therapy

## 2024-07-02 ENCOUNTER — Other Ambulatory Visit: Payer: Self-pay

## 2024-07-02 ENCOUNTER — Ambulatory Visit (INDEPENDENT_AMBULATORY_CARE_PROVIDER_SITE_OTHER): Admitting: Physical Therapy

## 2024-07-02 DIAGNOSIS — M25551 Pain in right hip: Secondary | ICD-10-CM | POA: Diagnosis not present

## 2024-07-02 DIAGNOSIS — M6281 Muscle weakness (generalized): Secondary | ICD-10-CM | POA: Diagnosis not present

## 2024-07-02 DIAGNOSIS — M79661 Pain in right lower leg: Secondary | ICD-10-CM

## 2024-07-02 DIAGNOSIS — M79662 Pain in left lower leg: Secondary | ICD-10-CM | POA: Diagnosis not present

## 2024-07-02 NOTE — Therapy (Signed)
 OUTPATIENT PHYSICAL THERAPY TREATMENT   Patient Name: Gina [name] MRN: [id number] DOB:06-28-1966, 58 y.o., female Today's Date: 07/02/2024   END OF SESSION:  PT End of Session - 07/02/24 1551     Visit Number 4    Number of Visits 9    Date for PT Re-Evaluation 08/02/24    Authorization Type BCBS    PT Start Time 1518    PT Stop Time 1602    PT Time Calculation (min) 44 min    Activity Tolerance Patient tolerated treatment well    Behavior During Therapy [facility] for tasks assessed/performed             Past Medical History:  Diagnosis Date   Acromioclavicular separation, type 1 12/19/2012   Injected in May 21, 2019 repeat injection December 05, 2019 repeat injection given Mar 10, 2021 repeat injection given March 08, 2023     Acute meniscal tear of knee    Allergy    Anxiety    Atypical mole 01/02/2008   Right Mid Paraspinal (slight)   Atypical mole 01/16/2009   Left Mid Paraspinal (moderate)   Atypical mole 10/07/2009   Mid Spinal (moderate to severe) terril)   Insomnia    Migraine    PMDD (premenstrual dysphoric disorder)    Past Surgical History:  Procedure Laterality Date   TONSILLECTOMY     Patient Active Problem List   Diagnosis Date Noted   Bilateral calf pain 05/24/2024   Loss of transverse plantar arch 12/16/2023   History of migraine headaches 11/16/2023   White coat syndrome without diagnosis of hypertension 11/16/2023   Secondary insomnia 11/16/2023   History of anxiety 11/16/2023   Hormone replacement therapy (HRT) 11/16/2023   Menopausal syndrome 11/23/2022   Iron deficiency anemia 01/09/2018    PCP: Jodie Lavern CROME, MD  REFERRING PROVIDER: Claudene Arthea HERO, DO  REFERRING DIAG: Piriformis syndrome, left; Exertional compartment syndrome of lower extremity, bilateral  THERAPY DIAG:  Pain in left lower leg  Pain in right lower leg  Pain in right hip  Muscle weakness (generalized)  Rationale for Evaluation and Treatment:  Rehabilitation  ONSET DATE: Chronic   SUBJECTIVE:  SUBJECTIVE STATEMENT: Patient reports she had a lot of soreness following needling of lower legs last visit. Patient reports the hip is good, a low amount of pain.   Eval: Patient reports bilateral lower leg pain and plantar foot pain for more than 6 months. She has used orthotics that have helped some but haven't alleviated all the pain. Pain in lower legs is constant. She also reports right hip tightness that feels like knotted and doesn't have as much flexibility. The hip tightness has been on and off for years. The hip tightness and lower leg pain is exacerbated with walking, so she doesn't walk as much anymore. Sometimes lifting heavy things from a squat is painful in the hip. She also reports she doesn't like to sleep on the right side because it uncomfortable. She does report some tingling in her feet with fatigue.  PERTINENT HISTORY: See PMH above  PAIN:  Are you having pain? Yes:  NPRS scale: 6-7/10 Pain location: Bilateral lower legs, plantar foot Pain description: Tightness, radiating Aggravating factors: Constant, walking Relieving factors: Using a roller, orthotics  NPRS scale: 5/10 Pain location: Right hip, posterior Pain description: Tightness Aggravating factors: Walking Relieving factors: Stretching, rolling  PRECAUTIONS: None  PATIENT GOALS: Pain relief to improve walking   OBJECTIVE:  Note: Objective measures were completed at Evaluation unless  otherwise noted. PATIENT SURVEYS:  PSFS: 3.33 Long walks / hikes: 4 Sleeping through the night: 3 Cycling: 3  MUSCLE LENGTH: Slight hamstring and piriformis restriction on right  POSTURE:   Grossly WFL  PALPATION: Tender to palpation right gluteal and piriformis region, bilateral anterior tib  LOWER EXTREMITY ROM:  Active ROM Right eval Left eval  Hip flexion    Hip extension    Hip abduction    Hip adduction    Hip internal rotation    Hip  external rotation    Knee flexion    Knee extension    Ankle dorsiflexion 10 10  Ankle plantarflexion 60 70  Ankle inversion 50 50  Ankle eversion 15 15   (Blank rows = not tested)  Hip PROM grossly WFL  LOWER EXTREMITY MMT:  MMT Right eval Left eval  Hip flexion    Hip extension 4 4  Hip abduction 4- 4  Hip adduction    Hip internal rotation    Hip external rotation    Knee flexion    Knee extension    Ankle dorsiflexion 5 5  Ankle plantarflexion 5 5  Ankle inversion 5 5  Ankle eversion 5 5   (Blank rows = not tested)  FUNCTIONAL TESTS:  Squat: decreased depth, hip dominant technique, right hip tightness  GAIT: Assistive device utilized: None Level of assistance: Complete Independence Comments: Grossly WFL                                                                                                                               TREATMENT [facility] Adult PT Treatment:                                                DATE: 07/02/2024 STM/TPR right gluteal region and bilateral soleus/peroneal region Supine piriformis stretch 3 x 20 sec on right Slant board calf stretch 3 x 20 sec Standing soleus stretch at wall 3 x 30 sec each  Discussed previous provided HEP  Trigger Point Dry Needling  Subsequent Treatment: Instructions provided previously at initial dry needling treatment.  Instructions reviewed, if requested by the patient, prior to subsequent dry needling treatment.   Patient Verbal Consent Given: No Education Handout Provided: No patient declined Muscles Treated: Right glute/piriformis region, bilateral soleus and peroneals Electrical Stimulation Performed: No Treatment Response/Outcome: Twitch response   PATIENT EDUCATION:  Education details: HEP update, TPDN Person educated: Patient Education method: Explanation, Demonstration, Tactile cues, Verbal cues Education comprehension: verbalized understanding, returned demonstration, verbal cues required,  tactile cues required, and needs further education  HOME EXERCISE PROGRAM: Access Code: [email or web address]    ASSESSMENT: CLINICAL IMPRESSION: Patient tolerated therapy well with no adverse effects. Continued with TPDN for bilateral lower legs and right glute/piriformis region. She was having more tenderness bilateral soleus and peroneal region  so focused primarily on those. Incorporated more gastroc and soleus stretching with good tolerance and updated her HEP. Patient would benefit from continued skilled PT to progress mobility and strength in order to reduce pain and maximize functional ability.   Eval: Patient is a 58 y.o. female who was seen today for physical therapy evaluation and treatment for chronic anterior lower leg pain and right hip tightness. She does report tenderness of bilateral anterior tib and right glute and piriformis region. She has some muscular tightness of the right hip as well as strength deficit. She demonstrates good ankle motion and strength. Performed TPDN for the right glute and piriformis region as well as bilateral anterior tib with twitch response.    OBJECTIVE IMPAIRMENTS: decreased activity tolerance, decreased strength, impaired flexibility, and pain.   ACTIVITY LIMITATIONS: squatting, sleeping, and locomotion level  PARTICIPATION LIMITATIONS: community activity  PERSONAL FACTORS: Fitness, Past/current experiences, and Time since onset of injury/illness/exacerbation are also affecting patient's functional outcome.    GOALS: Goals reviewed with patient? Yes  SHORT TERM GOALS: Target date: 07/05/2024  Patient will be I with initial HEP in order to progress with therapy. Baseline: HEP provided at eval Goal status: INITIAL  2.  Patient will report bilateral lower leg pain </= 4/10 in order to reduce functional limitations and improve walking Baseline: 7/10 Goal status: INITIAL  LONG TERM GOALS: Target date: 08/02/2024  Patient will be I with final HEP to  maintain progress from PT. Baseline: HEP provided at eval Goal status: INITIAL  2.  Patient will report PSFS >/= 7 in order to indicate improvement in their functional ability. Baseline: 3.33 Goal status: INITIAL  3.  Patient will demonstrate right glute strength >/= 4/5 MMT in order to improve hip control and reduce tightness Baseline: 4-/5 MMT Goal status: INITIAL  4.  Patient will report bilateral lower leg pain </= 2/10 and right hip tightness </= 1/10 in order to reduce functional limitations Baseline: see pain assessment above Goal status: INITIAL   PLAN: PT FREQUENCY: 1x/week  PT DURATION: 8 weeks  PLANNED INTERVENTIONS: 97164- PT Re-evaluation, 97750- Physical Performance Testing, 97110-Therapeutic exercises, 97530- Therapeutic activity, 97112- Neuromuscular re-education, 97535- Self Care, 02859- Manual therapy, 20560 (1-2 muscles), 20561 (3+ muscles)- Dry Needling, Patient/Family education, Balance training, Taping, Joint mobilization, Joint manipulation, Spinal manipulation, Spinal mobilization, Cryotherapy, and Moist heat  PLAN FOR NEXT SESSION: Review HEP and progress PRN, manual/TPDN for right hip and bilateral lower legs, continue with ankle and hip strengthening to improve activity tolerance   Elaine Daring, PT, DPT, LAT, ATC 07/02/24  4:12 PM Phone: [phone] Fax: [phone]

## 2024-07-02 NOTE — Patient Instructions (Signed)
 Access Code: [email or web address] URL: [email or web address] Date: 07/02/2024 Prepared by: Elaine Daring  Exercises - Supine Piriformis Stretch with Foot on Ground  - 1 x daily - 3 sets - 30 hold - Supine Hamstring Stretch  - 1 x daily - 2 sets - 10 reps - 5 seconds hold - Clam with Resistance  - 1 x daily - 3 sets - 10 reps - Toe Raise With Back Against Wall  - 1 x daily - 3 sets - 10 reps - 5 seconds hold - Seated Ankle Eversion with Resistance  - 1 x daily - 3 sets - 10 reps - Standing Heel Raise  - 1 x daily - 3 sets - 10 reps - Soleus Stretch on Wall  - 1 x daily - 3 reps - 30 seconds hold

## 2024-07-03 MED ORDER — CLONAZEPAM 1 MG PO TABS: 0.5000 mg | ORAL_TABLET | Freq: Every day | ORAL | 3 refills | Status: AC | PRN

## 2024-07-10 ENCOUNTER — Other Ambulatory Visit: Payer: Self-pay

## 2024-07-10 ENCOUNTER — Ambulatory Visit: Admitting: Physical Therapy

## 2024-07-10 ENCOUNTER — Encounter: Payer: Self-pay | Admitting: Physical Therapy

## 2024-07-10 DIAGNOSIS — M79662 Pain in left lower leg: Secondary | ICD-10-CM | POA: Diagnosis not present

## 2024-07-10 DIAGNOSIS — M79661 Pain in right lower leg: Secondary | ICD-10-CM

## 2024-07-10 DIAGNOSIS — M25551 Pain in right hip: Secondary | ICD-10-CM | POA: Diagnosis not present

## 2024-07-10 DIAGNOSIS — M6281 Muscle weakness (generalized): Secondary | ICD-10-CM | POA: Diagnosis not present

## 2024-07-10 NOTE — Therapy (Unsigned)
 OUTPATIENT PHYSICAL THERAPY TREATMENT   Patient Name: Gina [name] MRN: [id number] DOB:02/02/1966, 58 y.o., female Today's Date: 07/11/2024   END OF SESSION:  PT End of Session - 07/10/24 1611     Visit Number 5    Number of Visits 9    Date for PT Re-Evaluation 08/02/24    Authorization Type BCBS    PT Start Time 1610    PT Stop Time 1645    PT Time Calculation (min) 35 min    Activity Tolerance Patient tolerated treatment well    Behavior During Therapy [facility] for tasks assessed/performed              Past Medical History:  Diagnosis Date   Acromioclavicular separation, type 1 12/19/2012   Injected in May 21, 2019 repeat injection December 05, 2019 repeat injection given Mar 10, 2021 repeat injection given March 08, 2023     Acute meniscal tear of knee    Allergy    Anxiety    Atypical mole 01/02/2008   Right Mid Paraspinal (slight)   Atypical mole 01/16/2009   Left Mid Paraspinal (moderate)   Atypical mole 10/07/2009   Mid Spinal (moderate to severe) terril)   Insomnia    Migraine    PMDD (premenstrual dysphoric disorder)    Past Surgical History:  Procedure Laterality Date   TONSILLECTOMY     Patient Active Problem List   Diagnosis Date Noted   Bilateral calf pain 05/24/2024   Loss of transverse plantar arch 12/16/2023   History of migraine headaches 11/16/2023   White coat syndrome without diagnosis of hypertension 11/16/2023   Secondary insomnia 11/16/2023   History of anxiety 11/16/2023   Hormone replacement therapy (HRT) 11/16/2023   Menopausal syndrome 11/23/2022   Iron deficiency anemia 01/09/2018    PCP: Jodie Lavern CROME, MD  REFERRING PROVIDER: Claudene Arthea HERO, DO  REFERRING DIAG: Piriformis syndrome, left; Exertional compartment syndrome of lower extremity, bilateral  THERAPY DIAG:  Pain in left lower leg  Pain in right lower leg  Pain in right hip  Muscle weakness (generalized)  Rationale for Evaluation and Treatment:  Rehabilitation  ONSET DATE: Chronic   SUBJECTIVE:  SUBJECTIVE STATEMENT: Patient reports her right hip is doing better but still having tightness and achy pain in the front and side of the shins. Was bothering her more overnight.   Eval: Patient reports bilateral lower leg pain and plantar foot pain for more than 6 months. She has used orthotics that have helped some but haven't alleviated all the pain. Pain in lower legs is constant. She also reports right hip tightness that feels like knotted and doesn't have as much flexibility. The hip tightness has been on and off for years. The hip tightness and lower leg pain is exacerbated with walking, so she doesn't walk as much anymore. Sometimes lifting heavy things from a squat is painful in the hip. She also reports she doesn't like to sleep on the right side because it uncomfortable. She does report some tingling in her feet with fatigue.  PERTINENT HISTORY: See PMH above  PAIN:  Are you having pain? Yes:  NPRS scale: 7/10 Pain location: Bilateral lower legs, plantar foot Pain description: Tightness, radiating Aggravating factors: Constant, walking Relieving factors: Using a roller, orthotics  NPRS scale: 3/10 Pain location: Right hip, posterior Pain description: Tightness Aggravating factors: Walking Relieving factors: Stretching, rolling  PRECAUTIONS: None  PATIENT GOALS: Pain relief to improve walking   OBJECTIVE:  Note: Objective measures were completed  at Evaluation unless otherwise noted. PATIENT SURVEYS:  PSFS: 3.33 Long walks / hikes: 4 Sleeping through the night: 3 Cycling: 3  MUSCLE LENGTH: Slight hamstring and piriformis restriction on right  POSTURE:   Grossly WFL  PALPATION: Tender to palpation right gluteal and piriformis region, bilateral anterior tib  LOWER EXTREMITY ROM:  Active ROM Right eval Left eval  Hip flexion    Hip extension    Hip abduction    Hip adduction    Hip internal rotation     Hip external rotation    Knee flexion    Knee extension    Ankle dorsiflexion 10 10  Ankle plantarflexion 60 70  Ankle inversion 50 50  Ankle eversion 15 15   (Blank rows = not tested)  Hip PROM grossly WFL  LOWER EXTREMITY MMT:  MMT Right eval Left eval  Hip flexion    Hip extension 4 4  Hip abduction 4- 4  Hip adduction    Hip internal rotation    Hip external rotation    Knee flexion    Knee extension    Ankle dorsiflexion 5 5  Ankle plantarflexion 5 5  Ankle inversion 5 5  Ankle eversion 5 5   (Blank rows = not tested)  FUNCTIONAL TESTS:  Squat: decreased depth, hip dominant technique, right hip tightness  GAIT: Assistive device utilized: None Level of assistance: Complete Independence Comments: Grossly WFL                                                                                                                               TREATMENT [facility] Adult PT Treatment:                                                DATE: 07/10/2024 STM/TPR right gluteal region and bilateral peroneal region Slant board gastroc and soleus stretch stretch 3 x 20 sec each Standing piriformis stretch with leg on table 2 x 20 sec each  Discussed previous provided HEP  Trigger Point Dry Needling  Subsequent Treatment: Instructions provided previously at initial dry needling treatment.  Instructions reviewed, if requested by the patient, prior to subsequent dry needling treatment.   Patient Verbal Consent Given: No Education Handout Provided: No patient declined Muscles Treated: Right glute/piriformis region, bilateral peroneals and anterior tib Electrical Stimulation Performed: No Treatment Response/Outcome: Twitch response   PATIENT EDUCATION:  Education details: HEP, TPDN Person educated: Patient Education method: Explanation, Demonstration, Tactile cues, Verbal cues Education comprehension: verbalized understanding, returned demonstration, verbal cues required, tactile cues  required, and needs further education  HOME EXERCISE PROGRAM: Access Code: [email or web address]    ASSESSMENT: CLINICAL IMPRESSION: Patient tolerated therapy well with no adverse effects. Continued with TPDN for bilateral lower legs and right glute/piriformis region with good therapeutic benefit. He right hip symptoms seem to  be improving but continues to report bilateral lower leg tightness and achiness that seems to be changing locations. Continued with stretching for the hip and lower leg with good tolerance. No changes made to HEP this visit. Patient would benefit from continued skilled PT to progress mobility and strength in order to reduce pain and maximize functional ability.   Eval: Patient is a 58 y.o. female who was seen today for physical therapy evaluation and treatment for chronic anterior lower leg pain and right hip tightness. She does report tenderness of bilateral anterior tib and right glute and piriformis region. She has some muscular tightness of the right hip as well as strength deficit. She demonstrates good ankle motion and strength. Performed TPDN for the right glute and piriformis region as well as bilateral anterior tib with twitch response.    OBJECTIVE IMPAIRMENTS: decreased activity tolerance, decreased strength, impaired flexibility, and pain.   ACTIVITY LIMITATIONS: squatting, sleeping, and locomotion level  PARTICIPATION LIMITATIONS: community activity  PERSONAL FACTORS: Fitness, Past/current experiences, and Time since onset of injury/illness/exacerbation are also affecting patient's functional outcome.    GOALS: Goals reviewed with patient? Yes  SHORT TERM GOALS: Target date: 07/05/2024  Patient will be I with initial HEP in order to progress with therapy. Baseline: HEP provided at eval Goal status: INITIAL  2.  Patient will report bilateral lower leg pain </= 4/10 in order to reduce functional limitations and improve walking Baseline: 7/10 Goal status:  INITIAL  LONG TERM GOALS: Target date: 08/02/2024  Patient will be I with final HEP to maintain progress from PT. Baseline: HEP provided at eval Goal status: INITIAL  2.  Patient will report PSFS >/= 7 in order to indicate improvement in their functional ability. Baseline: 3.33 Goal status: INITIAL  3.  Patient will demonstrate right glute strength >/= 4/5 MMT in order to improve hip control and reduce tightness Baseline: 4-/5 MMT Goal status: INITIAL  4.  Patient will report bilateral lower leg pain </= 2/10 and right hip tightness </= 1/10 in order to reduce functional limitations Baseline: see pain assessment above Goal status: INITIAL   PLAN: PT FREQUENCY: 1x/week  PT DURATION: 8 weeks  PLANNED INTERVENTIONS: 97164- PT Re-evaluation, 97750- Physical Performance Testing, 97110-Therapeutic exercises, 97530- Therapeutic activity, 97112- Neuromuscular re-education, 97535- Self Care, 02859- Manual therapy, 20560 (1-2 muscles), 20561 (3+ muscles)- Dry Needling, Patient/Family education, Balance training, Taping, Joint mobilization, Joint manipulation, Spinal manipulation, Spinal mobilization, Cryotherapy, and Moist heat  PLAN FOR NEXT SESSION: Review HEP and progress PRN, manual/TPDN for right hip and bilateral lower legs, continue with ankle and hip strengthening to improve activity tolerance   Elaine Daring, PT, DPT, LAT, ATC 07/11/24  7:58 AM Phone: [phone] Fax: [phone]

## 2024-07-17 ENCOUNTER — Encounter: Admitting: Physical Therapy

## 2024-07-17 NOTE — Progress Notes (Unsigned)
 [name] [address] [place] [address] Phone: [phone] Subjective:   Gina [name], am serving as a scribe for Dr. Arthea [name].  I'm seeing this patient by the request  of:  Gina Lavern CROME, MD  CC: Back and neck pain follow-up as well as foot pain follow-up  YEP:Dlagzrupcz  Gina [name] is a 58 y.o. female coming in with complaint of back and neck pain. OMT 05/24/2024. Also f/u for B foot and L hip pain. Patient states feet are doing better. Hip is also better. Still having trouble with legs.  Medications patient has been prescribed: Gabapentin , IBU  Taking:      Reviewed prior external information including notes and imaging from previsou exam, outside providers and external EMR if available.   As well as notes that were available from care everywhere and other healthcare systems.  Past medical history, social, surgical and family history all reviewed in electronic medical record.  No pertanent information unless stated regarding to the chief complaint.   Past Medical History:  Diagnosis Date   Acromioclavicular separation, type 1 12/19/2012   Injected in May 21, 2019 repeat injection December 05, 2019 repeat injection given Mar 10, 2021 repeat injection given March 08, 2023     Acute meniscal tear of knee    Allergy    Anxiety    Atypical mole 01/02/2008   Right Mid Paraspinal (slight)   Atypical mole 01/16/2009   Left Mid Paraspinal (moderate)   Atypical mole 10/07/2009   Mid Spinal (moderate to severe) terril)   Insomnia    Migraine    PMDD (premenstrual dysphoric disorder)     No Known Allergies   Review of Systems:  No headache, visual changes, nausea, vomiting, diarrhea, constipation, dizziness, abdominal pain, skin rash, fevers, chills, night sweats, weight loss, swollen lymph nodes, body aches, joint swelling, chest pain, shortness of breath, mood changes. POSITIVE muscle aches  Objective  Blood  pressure (!) 126/90, pulse 65, height 5' 6 (1.676 m), weight 138 lb (62.6 kg), SpO2 98%.   General: No apparent distress alert and oriented x3 mood and affect normal, dressed appropriately.  HEENT: Pupils equal, extraocular movements intact  Respiratory: Patient's speak in full sentences and does not appear short of breath  Cardiovascular: No lower extremity edema, non tender, no erythema  Gait normal overall. MSK:  Back does have some loss of lordosis noted.  Some tenderness to palpation in the upper back.  Seems to be relatively clearly severe.  No midline tenderness.  Osteopathic findings  C7 flexed rotated and side bent right T3 extended rotated and side bent right inhaled rib L2 flexed rotated and side bent right Sacrum right on right       Assessment and Plan:  Upper back pain Multifactorial.  Discussed icing regimen and home exercises, which activities to do and which ones to avoid.  Patient is highly active.  Has responded well to osteopathic manipulation and after further evaluation today decided to try again.  Discussed which activities to do and which ones to avoid.  Increase activity slowly.  Follow-up again in 6 to 8 weeks otherwise  Bilateral calf pain Improving at the moment.  Patient signs and symptoms still consistent with potential small exertional compartment syndrome potentially.  Patient will continue with the exercises, discussed the possibility of massage therapy and Graston tool.  Follow-up again in 6 to 8 weeks if needed    Nonallopathic problems  Decision today to treat  with OMT was based on Physical Exam  After verbal consent patient was treated with HVLA, ME, FPR techniques in cervical, rib, thoracic, lumbar, and sacral  areas  Patient tolerated the procedure well with improvement in symptoms  Patient given exercises, stretches and lifestyle modifications  See medications in patient instructions if given  Patient will follow up in 4-8 weeks     The above documentation has been reviewed and is accurate and complete Gina CHRISTELLA Sharps, DO          Note: This dictation was prepared with Dragon dictation along with smaller phrase technology. Any transcriptional errors that result from this process are unintentional.

## 2024-07-19 ENCOUNTER — Ambulatory Visit: Admitting: Family Medicine

## 2024-07-19 ENCOUNTER — Encounter: Payer: Self-pay | Admitting: Family Medicine

## 2024-07-19 VITALS — BP 126/90 | HR 65 | Ht 66.0 in | Wt 138.0 lb

## 2024-07-19 DIAGNOSIS — M549 Dorsalgia, unspecified: Secondary | ICD-10-CM | POA: Diagnosis not present

## 2024-07-19 DIAGNOSIS — M9904 Segmental and somatic dysfunction of sacral region: Secondary | ICD-10-CM

## 2024-07-19 DIAGNOSIS — D229 Melanocytic nevi, unspecified: Secondary | ICD-10-CM | POA: Diagnosis not present

## 2024-07-19 DIAGNOSIS — M9902 Segmental and somatic dysfunction of thoracic region: Secondary | ICD-10-CM | POA: Diagnosis not present

## 2024-07-19 DIAGNOSIS — M79662 Pain in left lower leg: Secondary | ICD-10-CM

## 2024-07-19 DIAGNOSIS — M79661 Pain in right lower leg: Secondary | ICD-10-CM | POA: Diagnosis not present

## 2024-07-19 DIAGNOSIS — M9903 Segmental and somatic dysfunction of lumbar region: Secondary | ICD-10-CM

## 2024-07-19 DIAGNOSIS — M9901 Segmental and somatic dysfunction of cervical region: Secondary | ICD-10-CM

## 2024-07-19 NOTE — Assessment & Plan Note (Signed)
 Improving at the moment.  Patient signs and symptoms still consistent with potential small exertional compartment syndrome potentially.  Patient will continue with the exercises, discussed the possibility of massage therapy and Graston tool.  Follow-up again in 6 to 8 weeks if needed

## 2024-07-19 NOTE — Patient Instructions (Addendum)
 Referral to dermatology See me when you need me

## 2024-07-19 NOTE — Assessment & Plan Note (Signed)
 Multifactorial.  Discussed icing regimen and home exercises, which activities to do and which ones to avoid.  Patient is highly active.  Has responded well to osteopathic manipulation and after further evaluation today decided to try again.  Discussed which activities to do and which ones to avoid.  Increase activity slowly.  Follow-up again in 6 to 8 weeks otherwise

## 2024-07-19 NOTE — Assessment & Plan Note (Signed)
 Likely benign, patient is in the sun a lot and do feel that regular evaluation by dermatology could be good and referred accordingly

## 2024-07-24 ENCOUNTER — Encounter: Payer: Self-pay | Admitting: Physical Therapy

## 2024-07-24 ENCOUNTER — Other Ambulatory Visit: Payer: Self-pay

## 2024-07-24 ENCOUNTER — Ambulatory Visit: Admitting: Physical Therapy

## 2024-07-24 DIAGNOSIS — M6281 Muscle weakness (generalized): Secondary | ICD-10-CM

## 2024-07-24 DIAGNOSIS — M25551 Pain in right hip: Secondary | ICD-10-CM | POA: Diagnosis not present

## 2024-07-24 DIAGNOSIS — M79661 Pain in right lower leg: Secondary | ICD-10-CM | POA: Diagnosis not present

## 2024-07-24 DIAGNOSIS — M79662 Pain in left lower leg: Secondary | ICD-10-CM

## 2024-07-24 NOTE — Therapy (Addendum)
 " OUTPATIENT PHYSICAL THERAPY TREATMENT  DISCHARGE   Patient Name: Gina [name] MRN: [id number] DOB:[birth year], 58 y.o., female Today's Date: 07/24/2024   END OF SESSION:  PT End of Session - 07/24/24 1604     Visit Number 6    Number of Visits 9    Date for PT Re-Evaluation 08/02/24    Authorization Type BCBS    PT Start Time 1600    PT Stop Time 1645    PT Time Calculation (min) 45 min    Activity Tolerance Patient tolerated treatment well    Behavior During Therapy WFL for tasks assessed/performed               Past Medical History:  Diagnosis Date   Acromioclavicular separation, type 1 12/19/2012   Injected in May 21, 2019 repeat injection December 05, 2019 repeat injection given Mar 10, 2021 repeat injection given March 08, 2023     Acute meniscal tear of knee    Allergy    Anxiety    Atypical mole 01/02/2008   Right Mid Paraspinal (slight)   Atypical mole 01/16/2009   Left Mid Paraspinal (moderate)   Atypical mole 10/07/2009   Mid Spinal (moderate to severe) terril)   Insomnia    Migraine    PMDD (premenstrual dysphoric disorder)    Past Surgical History:  Procedure Laterality Date   TONSILLECTOMY     Patient Active Problem List   Diagnosis Date Noted   Upper back pain 07/19/2024   Atypical mole 07/19/2024   Bilateral calf pain 05/24/2024   Loss of transverse plantar arch 12/16/2023   History of migraine headaches 11/16/2023   White coat syndrome without diagnosis of hypertension 11/16/2023   Secondary insomnia 11/16/2023   History of anxiety 11/16/2023   Hormone replacement therapy (HRT) 11/16/2023   Menopausal syndrome 11/23/2022   Iron deficiency anemia 01/09/2018    PCP: Jodie Lavern CROME, MD  REFERRING PROVIDER: Claudene Arthea HERO, DO  REFERRING DIAG: Piriformis syndrome, left; Exertional compartment syndrome of lower extremity, bilateral  THERAPY DIAG:  Pain in left lower leg  Pain in right lower leg  Pain in right  hip  Muscle weakness (generalized)  Rationale for Evaluation and Treatment: Rehabilitation  ONSET DATE: Chronic   SUBJECTIVE:  SUBJECTIVE STATEMENT: Patient reports the right hip and feet are better but the lower legs are still achy and feel tight. She does feel like the stretching for the calves feels tight but the feels better, but hasn't noticed much a change with the front of the shins.   Eval: Patient reports bilateral lower leg pain and plantar foot pain for more than 6 months. She has used orthotics that have helped some but haven't alleviated all the pain. Pain in lower legs is constant. She also reports right hip tightness that feels like knotted and doesn't have as much flexibility. The hip tightness has been on and off for years. The hip tightness and lower leg pain is exacerbated with walking, so she doesn't walk as much anymore. Sometimes lifting heavy things from a squat is painful in the hip. She also reports she doesn't like to sleep on the right side because it uncomfortable. She does report some tingling in her feet with fatigue.  PERTINENT HISTORY: See PMH above  PAIN:  Are you having pain? Yes:  NPRS scale: 5/10 Pain location: Bilateral lower legs, plantar foot Pain description: Tightness, radiating Aggravating factors: Constant, walking Relieving factors: Using a roller, orthotics  NPRS scale: 1/10 Pain location:  Right hip, posterior Pain description: Tightness Aggravating factors: Walking Relieving factors: Stretching, rolling  PRECAUTIONS: None  PATIENT GOALS: Pain relief to improve walking   OBJECTIVE:  Note: Objective measures were completed at Evaluation unless otherwise noted. PATIENT SURVEYS:  PSFS: 3.33 Long walks / hikes: 4 Sleeping through the night: 3 Cycling: 3  MUSCLE LENGTH: Slight hamstring and piriformis restriction on right  POSTURE:   Grossly WFL  PALPATION: Tender to palpation right gluteal and piriformis region, bilateral  anterior tib  LOWER EXTREMITY ROM:  Active ROM Right eval Left eval  Hip flexion    Hip extension    Hip abduction    Hip adduction    Hip internal rotation    Hip external rotation    Knee flexion    Knee extension    Ankle dorsiflexion 10 10  Ankle plantarflexion 60 70  Ankle inversion 50 50  Ankle eversion 15 15   (Blank rows = not tested)  Hip PROM grossly WFL  LOWER EXTREMITY MMT:  MMT Right eval Left eval  Hip flexion    Hip extension 4 4  Hip abduction 4- 4  Hip adduction    Hip internal rotation    Hip external rotation    Knee flexion    Knee extension    Ankle dorsiflexion 5 5  Ankle plantarflexion 5 5  Ankle inversion 5 5  Ankle eversion 5 5   (Blank rows = not tested)  FUNCTIONAL TESTS:  Squat: decreased depth, hip dominant technique, right hip tightness  GAIT: Assistive device utilized: None Level of assistance: Complete Independence Comments: Grossly WFL                                                                                                                               TREATMENT [facility] Adult PT Treatment:                                                DATE: 07/10/2024 STM/TPR/IASTM for bilateral anterior tib, peroneal, and calf region Supine figure-4 piriformis stretch 3 x 30 sec  Discussed previous provided HEP and using ball/roller for lower leg and hip region at home.   Trigger Point Dry Needling  Subsequent Treatment: Instructions provided previously at initial dry needling treatment.  Instructions reviewed, if requested by the patient, prior to subsequent dry needling treatment.   Patient Verbal Consent Given: No Education Handout Provided: No patient declined Muscles Treated: Right glute/piriformis region, bilateral peroneals and anterior tib Electrical Stimulation Performed: No Treatment Response/Outcome: Twitch response   PATIENT EDUCATION:  Education details: HEP, TPDN Person educated: Patient Education method:  Explanation, Demonstration, Tactile cues, Verbal cues Education comprehension: verbalized understanding, returned demonstration, verbal cues required, tactile cues required, and needs further education  HOME EXERCISE PROGRAM: Access Code: [email or web address]    ASSESSMENT: CLINICAL  IMPRESSION: Patient tolerated therapy well with no adverse effects. Continued with TPDN for bilateral lower legs and right glute/piriformis region with good therapeutic benefit. Incorporated IASTM to bilateral lower legs this visit with good tolerance. She continues to report bilateral lower leg tightness and achiness with unclear etiology, more notable anterior and lateral this visit. He right hip tightness continues to improve. No changes made to HEP this visit. Patient would benefit from continued skilled PT to progress mobility and strength in order to reduce pain and maximize functional ability.   Eval: Patient is a 58 y.o. female who was seen today for physical therapy evaluation and treatment for chronic anterior lower leg pain and right hip tightness. She does report tenderness of bilateral anterior tib and right glute and piriformis region. She has some muscular tightness of the right hip as well as strength deficit. She demonstrates good ankle motion and strength. Performed TPDN for the right glute and piriformis region as well as bilateral anterior tib with twitch response.    OBJECTIVE IMPAIRMENTS: decreased activity tolerance, decreased strength, impaired flexibility, and pain.   ACTIVITY LIMITATIONS: squatting, sleeping, and locomotion level  PARTICIPATION LIMITATIONS: community activity  PERSONAL FACTORS: Fitness, Past/current experiences, and Time since onset of injury/illness/exacerbation are also affecting patient's functional outcome.    GOALS: Goals reviewed with patient? Yes  SHORT TERM GOALS: Target date: 07/05/2024  Patient will be I with initial HEP in order to progress with therapy. Baseline: HEP  provided at eval Goal status: INITIAL  2.  Patient will report bilateral lower leg pain </= 4/10 in order to reduce functional limitations and improve walking Baseline: 7/10 Goal status: INITIAL  LONG TERM GOALS: Target date: 08/02/2024  Patient will be I with final HEP to maintain progress from PT. Baseline: HEP provided at eval Goal status: INITIAL  2.  Patient will report PSFS >/= 7 in order to indicate improvement in their functional ability. Baseline: 3.33 Goal status: INITIAL  3.  Patient will demonstrate right glute strength >/= 4/5 MMT in order to improve hip control and reduce tightness Baseline: 4-/5 MMT Goal status: INITIAL  4.  Patient will report bilateral lower leg pain </= 2/10 and right hip tightness </= 1/10 in order to reduce functional limitations Baseline: see pain assessment above Goal status: INITIAL   PLAN: PT FREQUENCY: 1x/week  PT DURATION: 8 weeks  PLANNED INTERVENTIONS: 97164- PT Re-evaluation, 97750- Physical Performance Testing, 97110-Therapeutic exercises, 97530- Therapeutic activity, 97112- Neuromuscular re-education, 97535- Self Care, 02859- Manual therapy, 20560 (1-2 muscles), 20561 (3+ muscles)- Dry Needling, Patient/Family education, Balance training, Taping, Joint mobilization, Joint manipulation, Spinal manipulation, Spinal mobilization, Cryotherapy, and Moist heat  PLAN FOR NEXT SESSION: Review HEP and progress PRN, manual/TPDN for right hip and bilateral lower legs, continue with ankle and hip strengthening to improve activity tolerance   Elaine Daring, PT, DPT, LAT, ATC 07/24/24  4:49 PM Phone: [phone] Fax: [phone]   PHYSICAL THERAPY DISCHARGE SUMMARY  Visits from Start of Care: 6  Current functional level related to goals / functional outcomes: See above   Remaining deficits: See above   Education / Equipment: HEP   Patient agrees to discharge. Patient goals were partially met. Patient is being discharged due to  not returning since the last visit.  Elaine Daring, PT, DPT, LAT, ATC 11/21/2024  12:44 PM Phone: [phone] Fax: [phone]   "

## 2024-08-07 ENCOUNTER — Encounter: Admitting: Physical Therapy

## 2024-08-21 ENCOUNTER — Encounter: Admitting: Physical Therapy

## 2024-09-04 ENCOUNTER — Other Ambulatory Visit: Payer: Self-pay | Admitting: Family Medicine

## 2024-11-15 ENCOUNTER — Other Ambulatory Visit: Payer: Self-pay

## 2024-11-15 ENCOUNTER — Encounter: Payer: Self-pay | Admitting: Family Medicine

## 2024-11-15 ENCOUNTER — Ambulatory Visit: Admitting: Family Medicine

## 2024-11-15 VITALS — BP 142/80 | HR 75 | Ht 66.0 in

## 2024-11-15 DIAGNOSIS — M25511 Pain in right shoulder: Secondary | ICD-10-CM

## 2024-11-15 DIAGNOSIS — M7521 Bicipital tendinitis, right shoulder: Secondary | ICD-10-CM | POA: Diagnosis not present

## 2024-11-15 DIAGNOSIS — M7520 Bicipital tendinitis, unspecified shoulder: Secondary | ICD-10-CM | POA: Insufficient documentation

## 2024-11-15 MED ORDER — MELOXICAM 15 MG PO TABS: 15.0000 mg | ORAL_TABLET | Freq: Every day | ORAL | 0 refills | Status: AC

## 2024-11-15 NOTE — Assessment & Plan Note (Addendum)
 Atypical bicep tendon injury today seems to be more at the fascia where the long head and short head tendon seem to come together.  I think that this is causing patient to have more discomfort and pain.  Discussed icing regimen.  Discussed compression.  Home exercises given, avoid repetitive lifting for right now.  Follow-up again in 2 months to further evaluate meloxicam  noted

## 2024-11-15 NOTE — Patient Instructions (Addendum)
 Good to see you. Arm compression sleeve Hands in peripheral vision Ice after a long day Meloxicam  for 10 days then as needed See me again in 2 months

## 2024-11-15 NOTE — Progress Notes (Signed)
 " [name] [address] [place] [address] Phone: [phone] Subjective:    I'm seeing this patient by the request  of:  Gina Lavern CROME, MD  CC: Right shoulder and right elbow pain  YEP:Dlagzrupcz  Gina [name] is a 59 y.o. female coming in with complaint of right shoulder and right elbow pain. Started about three or four weeks ago. Didn't have an injury to cause the pain. The pain is located on the medial side of the shoulder. Pain in on the back of the elbow but it radiates to the front of her forearm. Pain keeps her from sleeping and sometimes she wakes up with her hands are numb. Tried ibuprofen  and massage.       Past Medical History:  Diagnosis Date   Acromioclavicular separation, type 1 12/19/2012   Injected in May 21, 2019 repeat injection December 05, 2019 repeat injection given Mar 10, 2021 repeat injection given March 08, 2023     Acute meniscal tear of knee    Allergy    Anxiety    Atypical mole 01/02/2008   Right Mid Paraspinal (slight)   Atypical mole 01/16/2009   Left Mid Paraspinal (moderate)   Atypical mole 10/07/2009   Mid Spinal (moderate to severe) (widershave)   Insomnia    Migraine    PMDD (premenstrual dysphoric disorder)    Past Surgical History:  Procedure Laterality Date   TONSILLECTOMY     Social History   Socioeconomic History   Marital status: Single    Spouse name: Not on file   Number of children: Not on file   Years of education: Not on file   Highest education level: Not on file  Occupational History   Not on file  Tobacco Use   Smoking status: Never   Smokeless tobacco: Never  Substance and Sexual Activity   Alcohol use: Yes    Alcohol/week: 4.0 standard drinks of alcohol    Types: 2 Cans of beer, 2 Shots of liquor per week   Drug use: No   Sexual activity: Yes    Birth control/protection: None    Comment: with females  Other Topics Concern   Not on file  Social History  Narrative   Not on file   Social Drivers of Health   Tobacco Use: Low Risk (07/24/2024)   Patient History    Smoking Tobacco Use: Never    Smokeless Tobacco Use: Never    Passive Exposure: Not on file  Financial Resource Strain: Not on file  Food Insecurity: Not on file  Transportation Needs: Not on file  Physical Activity: Not on file  Stress: Not on file  Social Connections: Not on file  Depression (PHQ2-9): Low Risk (11/16/2023)   Depression (PHQ2-9)    PHQ-2 Score: 0  Alcohol Screen: Not on file  Housing: Not on file  Utilities: Not on file  Health Literacy: Not on file   Allergies[1] Family History  Problem Relation Age of Onset   Hyperlipidemia Father    Hypertension Father    Prostate cancer Father    Prostate cancer Brother    Alcohol abuse Brother    Hypertension Daughter    Breast cancer Maternal Grandmother    Varicose Veins Maternal Grandfather    Hyperlipidemia Paternal Grandfather     Current Outpatient Medications (Analgesics):    meloxicam  (MOBIC ) 15 MG tablet, Take 1 tablet (15 mg total) by mouth daily.   ibuprofen  (ADVIL ) 600 MG tablet, TAKE 1  TABLET BY MOUTH EVERY 8 HOURS AS NEEDED  Current Outpatient Medications (Other):    Ascorbic Acid (VITAMIN C) 100 MG tablet, Take 100 mg by mouth daily.   Cholecalciferol (VITAMIN D3) 1.25 MG (50000 UT) CAPS, Take by mouth.   clonazePAM  (KLONOPIN ) 1 MG tablet, Take 0.5 tablets (0.5 mg total) by mouth daily as needed for anxiety.   gabapentin  (NEURONTIN ) 300 MG capsule, Take 1 capsule by mouth at bedtime   magnesium 30 MG tablet, Take 30 mg by mouth 2 (two) times daily.   Multiple Vitamin (MULTIVITAMIN ADULT PO),    St Johns Wort 150 MG CAPS, Take by mouth.   Testosterone  20 % CREA, Compounded Testosterone  Cream/Gel 2%  Testosterone  Proprionate 2 mg/gram in gel or cream base  use small pea sized amount as directed once a week   Reviewed prior external information including notes and imaging from  primary care  provider As well as notes that were available from care everywhere and other healthcare systems.  Past medical history, social, surgical and family history all reviewed in electronic medical record.  No pertanent information unless stated regarding to the chief complaint.   Review of Systems:  No headache, visual changes, nausea, vomiting, diarrhea, constipation, dizziness, abdominal pain, skin rash, fevers, chills, night sweats, weight loss, swollen lymph nodes, body aches, joint swelling, chest pain, shortness of breath, mood changes. POSITIVE muscle aches  Objective  Blood pressure (!) 142/80, pulse 75, height 5' 6 (1.676 m), SpO2 98%.   General: No apparent distress alert and oriented x3 mood and affect normal, dressed appropriately.  HEENT: Pupils equal, extraocular movements intact  Respiratory: Patient's speak in full sentences and does not appear short of breath  Cardiovascular: No lower extremity edema, non tender, no erythema   Right shoulder exam does have mild positive impingement noted.  Some mild discomfort with speed and Yergason's.  5 out of 5 strength of the rotator cuff.  Neck exam has some mild loss of lordosis  Limited muscular skeletal ultrasound was performed and interpreted by Gina [name], M  Limited ultrasound in patient's shoulder shows some hypoechoic changes noted seems to be more of a fascial injury right where the long head and the short head combine.  Bicep tendon itself does not seem to have any significant tear noted.   Impression and Recommendations:    The above documentation has been reviewed and is accurate and complete Kainen Struckman M Lashuna Tamashiro, DO        [1] No Known Allergies  "

## 2024-11-29 ENCOUNTER — Other Ambulatory Visit: Payer: Self-pay | Admitting: Family Medicine

## 2024-12-03 ENCOUNTER — Other Ambulatory Visit: Payer: Self-pay | Admitting: Family Medicine

## 2025-01-14 ENCOUNTER — Ambulatory Visit: Admitting: Family Medicine

## 2025-01-16 ENCOUNTER — Encounter: Admitting: Family Medicine

## 2025-02-21 ENCOUNTER — Ambulatory Visit: Admitting: Dermatology
